# Patient Record
Sex: Male | Born: 1935 | Race: White | Marital: Married | Smoking: Former smoker
Health system: Southern US, Community
[De-identification: ages and names within clinical notes are randomized; demographics above are authoritative.]

## PROBLEM LIST (undated history)

## (undated) DIAGNOSIS — T8859XA Other complications of anesthesia, initial encounter: Secondary | ICD-10-CM

## (undated) DIAGNOSIS — Z8711 Personal history of peptic ulcer disease: Secondary | ICD-10-CM

## (undated) DIAGNOSIS — J690 Pneumonitis due to inhalation of food and vomit: Secondary | ICD-10-CM

## (undated) DIAGNOSIS — I10 Essential (primary) hypertension: Secondary | ICD-10-CM

## (undated) DIAGNOSIS — I639 Cerebral infarction, unspecified: Secondary | ICD-10-CM

## (undated) DIAGNOSIS — F0390 Unspecified dementia without behavioral disturbance: Secondary | ICD-10-CM

## (undated) DIAGNOSIS — Z9181 History of falling: Secondary | ICD-10-CM

## (undated) DIAGNOSIS — M792 Neuralgia and neuritis, unspecified: Secondary | ICD-10-CM

## (undated) DIAGNOSIS — Z8659 Personal history of other mental and behavioral disorders: Secondary | ICD-10-CM

## (undated) DIAGNOSIS — Z87442 Personal history of urinary calculi: Secondary | ICD-10-CM

## (undated) DIAGNOSIS — R413 Other amnesia: Principal | ICD-10-CM

## (undated) DIAGNOSIS — S2239XA Fracture of one rib, unspecified side, initial encounter for closed fracture: Secondary | ICD-10-CM

## (undated) DIAGNOSIS — Z8719 Personal history of other diseases of the digestive system: Secondary | ICD-10-CM

## (undated) DIAGNOSIS — R269 Unspecified abnormalities of gait and mobility: Secondary | ICD-10-CM

## (undated) DIAGNOSIS — G500 Trigeminal neuralgia: Secondary | ICD-10-CM

## (undated) DIAGNOSIS — N429 Disorder of prostate, unspecified: Secondary | ICD-10-CM

## (undated) DIAGNOSIS — T4145XA Adverse effect of unspecified anesthetic, initial encounter: Secondary | ICD-10-CM

## (undated) DIAGNOSIS — C61 Malignant neoplasm of prostate: Secondary | ICD-10-CM

## (undated) DIAGNOSIS — G63 Polyneuropathy in diseases classified elsewhere: Secondary | ICD-10-CM

## (undated) HISTORY — PX: LITHOTRIPSY: SUR834

## (undated) HISTORY — PX: JOINT REPLACEMENT: SHX530

## (undated) HISTORY — DX: Unspecified abnormalities of gait and mobility: R26.9

## (undated) HISTORY — DX: Trigeminal neuralgia: G50.0

## (undated) HISTORY — DX: Other amnesia: R41.3

## (undated) HISTORY — PX: CATARACT EXTRACTION: SUR2

## (undated) HISTORY — DX: Pneumonitis due to inhalation of food and vomit: J69.0

## (undated) HISTORY — DX: Polyneuropathy in diseases classified elsewhere: G63

## (undated) HISTORY — PX: APPENDECTOMY: SHX54

## (undated) HISTORY — DX: Fracture of one rib, unspecified side, initial encounter for closed fracture: S22.39XA

## (undated) HISTORY — PX: KNEE SURGERY: SHX244

---

## 1998-03-25 DIAGNOSIS — I639 Cerebral infarction, unspecified: Secondary | ICD-10-CM

## 1998-03-25 HISTORY — DX: Cerebral infarction, unspecified: I63.9

## 2011-06-17 ENCOUNTER — Other Ambulatory Visit: Payer: Self-pay | Admitting: Neurology

## 2011-06-17 DIAGNOSIS — F0390 Unspecified dementia without behavioral disturbance: Secondary | ICD-10-CM

## 2011-07-04 ENCOUNTER — Ambulatory Visit
Admission: RE | Admit: 2011-07-04 | Discharge: 2011-07-04 | Disposition: A | Discharge status: Home / Self Care | Payer: Medicare Other | Source: Ambulatory Visit | Attending: Neurology | Admitting: Neurology

## 2011-07-04 DIAGNOSIS — F0390 Unspecified dementia without behavioral disturbance: Secondary | ICD-10-CM

## 2012-09-04 ENCOUNTER — Encounter (HOSPITAL_COMMUNITY): Payer: Self-pay | Admitting: Emergency Medicine

## 2012-09-04 ENCOUNTER — Emergency Department (HOSPITAL_COMMUNITY): Payer: Medicare Other

## 2012-09-04 ENCOUNTER — Observation Stay (HOSPITAL_COMMUNITY)
Admission: EM | Admit: 2012-09-04 | Discharge: 2012-09-06 | Disposition: A | Discharge status: Home / Self Care | Payer: Medicare Other | Attending: Family Medicine | Admitting: Family Medicine

## 2012-09-04 DIAGNOSIS — I10 Essential (primary) hypertension: Secondary | ICD-10-CM | POA: Diagnosis present

## 2012-09-04 DIAGNOSIS — D72829 Elevated white blood cell count, unspecified: Secondary | ICD-10-CM

## 2012-09-04 DIAGNOSIS — Z8673 Personal history of transient ischemic attack (TIA), and cerebral infarction without residual deficits: Secondary | ICD-10-CM | POA: Insufficient documentation

## 2012-09-04 DIAGNOSIS — R7309 Other abnormal glucose: Secondary | ICD-10-CM | POA: Insufficient documentation

## 2012-09-04 DIAGNOSIS — Z7902 Long term (current) use of antithrombotics/antiplatelets: Secondary | ICD-10-CM | POA: Insufficient documentation

## 2012-09-04 DIAGNOSIS — G609 Hereditary and idiopathic neuropathy, unspecified: Secondary | ICD-10-CM | POA: Insufficient documentation

## 2012-09-04 DIAGNOSIS — I951 Orthostatic hypotension: Secondary | ICD-10-CM | POA: Insufficient documentation

## 2012-09-04 DIAGNOSIS — R739 Hyperglycemia, unspecified: Secondary | ICD-10-CM | POA: Diagnosis present

## 2012-09-04 DIAGNOSIS — R079 Chest pain, unspecified: Principal | ICD-10-CM | POA: Diagnosis present

## 2012-09-04 DIAGNOSIS — K922 Gastrointestinal hemorrhage, unspecified: Secondary | ICD-10-CM | POA: Diagnosis present

## 2012-09-04 DIAGNOSIS — F0390 Unspecified dementia without behavioral disturbance: Secondary | ICD-10-CM | POA: Diagnosis present

## 2012-09-04 DIAGNOSIS — R0989 Other specified symptoms and signs involving the circulatory and respiratory systems: Secondary | ICD-10-CM | POA: Insufficient documentation

## 2012-09-04 DIAGNOSIS — Z79899 Other long term (current) drug therapy: Secondary | ICD-10-CM | POA: Insufficient documentation

## 2012-09-04 HISTORY — DX: Other complications of anesthesia, initial encounter: T88.59XA

## 2012-09-04 HISTORY — DX: Adverse effect of unspecified anesthetic, initial encounter: T41.45XA

## 2012-09-04 HISTORY — DX: Cerebral infarction, unspecified: I63.9

## 2012-09-04 HISTORY — DX: Essential (primary) hypertension: I10

## 2012-09-04 HISTORY — DX: Unspecified dementia, unspecified severity, without behavioral disturbance, psychotic disturbance, mood disturbance, and anxiety: F03.90

## 2012-09-04 HISTORY — DX: Disorder of prostate, unspecified: N42.9

## 2012-09-04 HISTORY — DX: Neuralgia and neuritis, unspecified: M79.2

## 2012-09-04 LAB — BASIC METABOLIC PANEL
Calcium: 8.8 mg/dL (ref 8.4–10.5)
Creatinine, Ser: 0.98 mg/dL (ref 0.50–1.35)
GFR calc non Af Amer: 76 mL/min — ABNORMAL LOW (ref >90)
Sodium: 136 mEq/L (ref 135–145)

## 2012-09-04 LAB — OCCULT BLOOD X 1 CARD TO LAB, STOOL: Fecal Occult Bld: POSITIVE — AB

## 2012-09-04 LAB — POCT I-STAT TROPONIN I: Troponin i, poc: 0.01 ng/mL (ref 0.00–0.08)

## 2012-09-04 LAB — CBC
HCT: 30.7 % — ABNORMAL LOW (ref 39.0–52.0)
Hemoglobin: 10.4 g/dL — ABNORMAL LOW (ref 13.0–17.0)
MCH: 32.7 pg (ref 26.0–34.0)
MCHC: 33.3 g/dL (ref 30.0–36.0)
MCHC: 33.9 g/dL (ref 30.0–36.0)
MCV: 98.2 fL (ref 78.0–100.0)
Platelets: 211 10*3/uL (ref 150–400)
RBC: 3.14 MIL/uL — ABNORMAL LOW (ref 4.22–5.81)
RDW: 12.9 % (ref 11.5–15.5)
WBC: 13.9 10*3/uL — ABNORMAL HIGH (ref 4.0–10.5)

## 2012-09-04 LAB — PRO B NATRIURETIC PEPTIDE: Pro B Natriuretic peptide (BNP): 49.7 pg/mL (ref 0–450)

## 2012-09-04 LAB — GLUCOSE, CAPILLARY: Glucose-Capillary: 153 mg/dL — ABNORMAL HIGH (ref 70–99)

## 2012-09-04 MED ORDER — ASPIRIN EC 325 MG PO TBEC
325.0000 mg | DELAYED_RELEASE_TABLET | Freq: Every day | ORAL | Status: DC
  Filled 2012-09-04: qty 1

## 2012-09-04 MED ORDER — INSULIN ASPART 100 UNIT/ML ~~LOC~~ SOLN: 0.0000 [IU] | Freq: Every day | SUBCUTANEOUS | Status: DC

## 2012-09-04 MED ORDER — LORAZEPAM 0.5 MG PO TABS: 0.5000 mg | ORAL_TABLET | Freq: Once | ORAL | Status: AC | PRN

## 2012-09-04 MED ORDER — ACETAMINOPHEN 500 MG PO TABS: 1000.0000 mg | ORAL_TABLET | Freq: Four times a day (QID) | ORAL | Status: DC | PRN

## 2012-09-04 MED ORDER — ONDANSETRON HCL 4 MG PO TABS: 4.0000 mg | ORAL_TABLET | Freq: Four times a day (QID) | ORAL | Status: DC | PRN

## 2012-09-04 MED ORDER — HEPARIN SODIUM (PORCINE) 5000 UNIT/ML IJ SOLN
5000.0000 [IU] | Freq: Three times a day (TID) | INTRAMUSCULAR | Status: DC
  Administered 2012-09-04: 5000 [IU] via SUBCUTANEOUS
  Filled 2012-09-04 (×8): qty 1

## 2012-09-04 MED ORDER — ONDANSETRON HCL 4 MG/2ML IJ SOLN: 4.0000 mg | Freq: Three times a day (TID) | INTRAMUSCULAR | Status: DC | PRN

## 2012-09-04 MED ORDER — ONDANSETRON HCL 4 MG/2ML IJ SOLN
4.0000 mg | Freq: Four times a day (QID) | INTRAMUSCULAR | Status: DC | PRN
  Administered 2012-09-04: 4 mg via INTRAVENOUS
  Filled 2012-09-04: qty 2

## 2012-09-04 MED ORDER — SODIUM CHLORIDE 0.9 % IJ SOLN: 3.0000 mL | Freq: Two times a day (BID) | INTRAMUSCULAR | Status: DC

## 2012-09-04 MED ORDER — SODIUM CHLORIDE 0.9 % IJ SOLN
3.0000 mL | Freq: Two times a day (BID) | INTRAMUSCULAR | Status: DC
  Administered 2012-09-04 – 2012-09-05 (×2): 3 mL via INTRAVENOUS

## 2012-09-04 MED ORDER — RIVASTIGMINE 9.5 MG/24HR TD PT24
9.5000 mg | MEDICATED_PATCH | Freq: Every day | TRANSDERMAL | Status: DC
  Administered 2012-09-05 – 2012-09-06 (×2): 9.5 mg via TRANSDERMAL
  Filled 2012-09-04 (×3): qty 1

## 2012-09-04 MED ORDER — SODIUM CHLORIDE 0.9 % IJ SOLN: 3.0000 mL | INTRAMUSCULAR | Status: DC | PRN

## 2012-09-04 MED ORDER — SODIUM CHLORIDE 0.9 % IV SOLN: 250.0000 mL | INTRAVENOUS | Status: DC | PRN

## 2012-09-04 MED ORDER — PANTOPRAZOLE SODIUM 40 MG PO TBEC
40.0000 mg | DELAYED_RELEASE_TABLET | Freq: Every evening | ORAL | Status: DC
  Administered 2012-09-05: 40 mg via ORAL
  Filled 2012-09-04 (×3): qty 1

## 2012-09-04 MED ORDER — METOPROLOL TARTRATE 25 MG PO TABS
25.0000 mg | ORAL_TABLET | Freq: Two times a day (BID) | ORAL | Status: DC
  Administered 2012-09-04 – 2012-09-06 (×4): 25 mg via ORAL
  Filled 2012-09-04 (×5): qty 1

## 2012-09-04 MED ORDER — ALUM & MAG HYDROXIDE-SIMETH 200-200-20 MG/5ML PO SUSP: 30.0000 mL | Freq: Four times a day (QID) | ORAL | Status: DC | PRN

## 2012-09-04 MED ORDER — CLOPIDOGREL BISULFATE 75 MG PO TABS
75.0000 mg | ORAL_TABLET | Freq: Every day | ORAL | Status: DC
  Administered 2012-09-05 – 2012-09-06 (×2): 75 mg via ORAL
  Filled 2012-09-04 (×2): qty 1

## 2012-09-04 MED ORDER — IRBESARTAN 75 MG PO TABS
75.0000 mg | ORAL_TABLET | Freq: Every day | ORAL | Status: DC
  Administered 2012-09-05 – 2012-09-06 (×2): 75 mg via ORAL
  Filled 2012-09-04 (×2): qty 1

## 2012-09-04 MED ORDER — ADULT MULTIVITAMIN W/MINERALS CH
1.0000 | ORAL_TABLET | Freq: Every morning | ORAL | Status: DC
  Administered 2012-09-05 – 2012-09-06 (×2): 1 via ORAL
  Filled 2012-09-04 (×2): qty 1

## 2012-09-04 MED ORDER — SODIUM CHLORIDE 0.9 % IV SOLN
INTRAVENOUS | Status: DC
  Administered 2012-09-04: 50 mL/h via INTRAVENOUS

## 2012-09-04 MED ORDER — NITROGLYCERIN 0.4 MG SL SUBL: 0.4000 mg | SUBLINGUAL_TABLET | SUBLINGUAL | Status: DC | PRN

## 2012-09-04 MED ORDER — INSULIN ASPART 100 UNIT/ML ~~LOC~~ SOLN
0.0000 [IU] | Freq: Three times a day (TID) | SUBCUTANEOUS | Status: DC
  Administered 2012-09-05: 2 [IU] via SUBCUTANEOUS

## 2012-09-04 NOTE — Progress Notes (Signed)
Patient was admitted from E.D.  Patient just had 250 ml brown and malodorous  emesis as well as black ,malodourous stool. Patient stated that he felt better after emesis and stool. Patient is currently resting in bed.  MD on call was notified.

## 2012-09-04 NOTE — ED Notes (Signed)
Pt was in surgical waiting area and started having hungry feelings then got real dizzy, lightheaded, sweating, nauseated and chest pain that started 3pm today.  Pt has PMH strokes. Pt denies not eating well today.

## 2012-09-04 NOTE — Progress Notes (Signed)
pcp is Alden Benjamin [phone] at [facility] per pt and daughter Cm assisted by going to lobby to bring back grandson Pt reports only feeling dizzy.  Daughter has already called the dr on call for Dr Lamont Dowdy to assist with pt's medication list

## 2012-09-04 NOTE — H&P (Signed)
[facility] History and Physical  CARNELL BEAVERS ZOX:[id number] DOB: 1931/02/11 DOA: 09/04/2012  Referring physician: Dr. Hyacinth Meeker PCP: Provider Not In System  Specialists: none  Chief Complaint: Chest pain on exertion  HPI: Jeremy Shea is a 79 y.o. male  With past medical history of multiple strokes in the past currently on Plavix, dementia and hypertension that comes in for chest pain on exertion, he relates he developed acute onset of weakness as he was walking brisk leak and then developed chest pain retrosternal that got better with rest with nausea and heavily diaphoretic per her daughter's. The patient was at the [facility] visiting his wife he just had surgery. He relates it lasted about 10 minutes and it resolved. Nurses came to his rescue started him on oxygen check his blood sugar his saturations were okay and blood glucose was greater than 100.   In the ED: - His blood pressure was checked which is mildly elevated his white count is elevated at 11.0 he has a blood glucose of 206 and he is not a diabetic, EKG shows sinus rhythm with nonspecific T-wave changes in the inferior leads. First set of cardiac enzymes negative x1.  Review of Systems: The patient denies anorexia, fever, weight loss,, vision loss, decreased hearing, hoarseness, peripheral edema, balance deficits, hemoptysis, abdominal pain, melena, hematochezia, severe indigestion/heartburn, hematuria, incontinence, genital sores, muscle weakness, suspicious skin lesions, transient blindness, difficulty walking, depression, unusual weight change, abnormal bleeding, enlarged lymph nodes, angioedema, and breast masses.   Past Medical History  Diagnosis Date  . Hypertension   . Stroke   . Prostate disorder   . Dementia   . Neuropathic pain of foot    Past Surgical History  Procedure Laterality Date  . Appendectomy    . Joint replacement     Social History:  reports that he has quit smoking. His smoking  use included Cigarettes. He smoked 2.00 packs per day. He does not have any smokeless tobacco history on file. He reports that he does not drink alcohol or use illicit drugs.   Allergies  Allergen Reactions  . Penicillins   . Codeine Anxiety and Other (See Comments)    Nervousness    Family History  Problem Relation Age of Onset  . Heart attack Mother   . Heart failure Father   . Heart attack Father   . Cancer - Lung Brother     Prior to Admission medications   Medication Sig Start Date End Date Taking? Authorizing Provider  acetaminophen (TYLENOL) 500 MG tablet Take 1,000 mg by mouth every 6 (six) hours as needed for pain.   Yes Historical Provider, MD  clopidogrel (PLAVIX) 75 MG tablet Take 75 mg by mouth daily.   Yes Historical Provider, MD  fluticasone (FLONASE) 50 MCG/ACT nasal spray Place 1 spray into the nose daily as needed for rhinitis or allergies.    Yes Historical Provider, MD  metoprolol tartrate (LOPRESSOR) 25 MG tablet Take 25 mg by mouth 2 (two) times daily.   Yes Historical Provider, MD  Multiple Vitamin (MULTIVITAMIN WITH MINERALS) TABS Take 1 tablet by mouth every morning.    Yes Historical Provider, MD  olmesartan (BENICAR) 40 MG tablet Take 40 mg by mouth daily.   Yes Historical Provider, MD  pantoprazole (PROTONIX) 40 MG tablet Take 40 mg by mouth every evening.    Yes Historical Provider, MD  rivastigmine (EXELON) 9.5 mg/24hr Place 1 patch onto the skin daily.   Yes Historical Provider, MD  Physical Exam: Filed Vitals:   09/04/12 1653 09/04/12 1840 09/04/12 1926  BP: 145/57 154/66 147/73  Pulse: 65 76 77  Temp: 98.1 F (36.7 C)    TempSrc: Oral    Resp: 16 19   SpO2: 98% 98% 100%    BP 147/73  Pulse 77  Temp(Src) 98.1 F (36.7 C) (Oral)  Resp 19  SpO2 100%  General Appearance:    Alert, cooperative, no distress, appears stated age  Head:    Normocephalic, without obvious abnormality, atraumatic           Throat:   Lips, mucosa, and tongue  normal; teeth and gums normal  Neck:   Supple, symmetrical, trachea midline, no adenopathy;       thyroid:  No enlargement/tenderness/nodules; no carotid   bruit or JVD  Back:     Symmetric, no curvature, ROM normal, no CVA tenderness  Lungs:     Clear to auscultation bilaterally, respirations unlabored  Chest wall:    No tenderness or deformity  Heart:    Regular rate and rhythm, S1 and S2 normal, no murmur, rub   or gallop  Abdomen:     Soft, non-tender, bowel sounds active all four quadrants,    no masses, no organomegaly        Extremities:   Extremities normal, atraumatic, no cyanosis or edema  Pulses:   2+ and symmetric all extremities  Skin:   Skin color, texture, turgor normal, no rashes or lesions  Lymph nodes:   Cervical, supraclavicular, and axillary nodes normal  Neurologic:   CNII-XII intact. Normal strength, sensation and reflexes      throughout    Labs on Admission:  Basic Metabolic Panel:  Recent Labs Lab 09/04/12 1708  NA 136  K 4.2  CL 102  CO2 25  GLUCOSE 206*  BUN 23  CREATININE 0.98  CALCIUM 8.8   Liver Function Tests: No results found for this basename: AST, ALT, ALKPHOS, BILITOT, PROT, ALBUMIN,  in the last 168 hours No results found for this basename: LIPASE, AMYLASE,  in the last 168 hours No results found for this basename: AMMONIA,  in the last 168 hours CBC:  Recent Labs Lab 09/04/12 1708  WBC 11.0*  HGB 10.9*  HCT 32.7*  MCV 98.2  PLT 211   Cardiac Enzymes: No results found for this basename: CKTOTAL, CKMB, CKMBINDEX, TROPONINI,  in the last 168 hours  BNP (last 3 results)  Recent Labs  09/04/12 1708  PROBNP 49.7   CBG: No results found for this basename: GLUCAP,  in the last 168 hours  Radiological Exams on Admission: Ct Head Wo Contrast  09/04/2012   *RADIOLOGY REPORT*  Clinical Data: Slurred speech.  Dizziness.  Severe headache.  Right- sided weakness  CT HEAD WITHOUT CONTRAST  Technique:  Contiguous axial images were  obtained from the base of the skull through the vertex without contrast.  Comparison: MRI on 07/04/2011  Findings: There is no evidence of intracranial hemorrhage, brain edema or other signs of acute infarction.  There is no evidence of intracranial mass lesion or mass effect.  No abnormal extra-axial fluid collections are identified.  Mild diffuse cerebral atrophy is stable.  Chronic small vessel disease is unchanged in appearance.  Ventricles are stable in size. No skull abnormality identified.  IMPRESSION:  1.  No acute intracranial findings. 2.  Stable cerebral atrophy and chronic small vessel disease.   Original Report Authenticated By: Myles Rosenthal, M.D.   Dg Chest [facility]  1 View  09/04/2012   *RADIOLOGY REPORT*  Clinical Data: Acute onset shortness of breath.  PORTABLE CHEST - 1 VIEW  Comparison: None.  Findings: Heart size is within normal limits.  Both lungs are clear.  No evidence of pleural effusion.  No mass or lymphadenopathy identified.  Old bilateral rib fracture deformities incidentally noted as well as left shoulder prosthesis.  IMPRESSION: No acute findings.   Original Report Authenticated By: Myles Rosenthal, M.D.    EKG: Independently reviewed. Normal sinus rhythm PVC and nonspecific T-wave changes in the inferior leads  Assessment/Plan Chest pain on exertion - Typical chest pain now resolved. We'll need to telemetry monitor for any arrhythmias, with crackles cardiac enzymes x3. We'll get a 2-D echo check a TSH and consult cardiology as he doesn't have a cardiologist. He is going to need a stress test. - Continue metoprolol start him on statins, and aspirin. We'll continue Plavix. - NTG PRN.  Dementia: - Continue home meds. Stable  HTN (hypertension): - Blood pressures mildly elevated. We'll continue metoprolol.  Hyperglycemia - Start sliding scale insulin.   Code Status: full Family Communication: daughter Disposition Plan: OBSERVATION (indicate anticipated LOS)  Time spent: 44  MINUTES  FELIZ Rosine Beat [facility] Pager [phone]  If 7PM-7AM, please contact night-coverage [email or web address] Password [facility] 09/04/2012, 8:22 PM

## 2012-09-04 NOTE — ED Provider Notes (Signed)
History     CSN: [id number]  Arrival date & time 09/04/12  1645   First MD Initiated Contact with Patient 09/04/12 1700      Chief Complaint  Patient presents with  . Dizziness  . Chest Pain    (Consider location/radiation/quality/duration/timing/severity/associated sxs/prior treatment) HPI Comments: 79 year old male with a history of hypertension, stroke, dementia who presents with a complaint of acute onset of weakness, chest pain, nausea and diaphoresis which occurred just prior to arrival while he was walking upstairs in the hospital towards his wife's hospital room. He states that he was walking briskly, faster than usual when he had acute onset of symptoms. This was persistent, lasted approximately 20 minutes and resolved as he came down to the emergency department. He has no history of heart disease though he does have a history of a stroke and hypertension. He has had a stress test many years ago. He states that the chest pain is described as kind of a burning feeling however he describes this differently throughout the encounter.  Patient is a 79 y.o. male presenting with chest pain. The history is provided by the patient, the spouse and a relative.  Chest Pain   Past Medical History  Diagnosis Date  . Hypertension   . Stroke   . Prostate disorder   . Dementia   . Neuropathic pain of foot     Past Surgical History  Procedure Laterality Date  . Appendectomy    . Joint replacement      Family History  Problem Relation Age of Onset  . Heart attack Mother   . Heart failure Father   . Heart attack Father   . Cancer - Lung Brother     History  Substance Use Topics  . Smoking status: Former Smoker -- 2.00 packs/day    Types: Cigarettes  . Smokeless tobacco: Not on file  . Alcohol Use: No      Review of Systems  Cardiovascular: Positive for chest pain.  All other systems reviewed and are negative.    Allergies  Penicillins and Codeine  Home Medications    Current Outpatient Rx  Name  Route  Sig  Dispense  Refill  . acetaminophen (TYLENOL) 500 MG tablet   Oral   Take 1,000 mg by mouth every 6 (six) hours as needed for pain.         Marland Kitchen clopidogrel (PLAVIX) 75 MG tablet   Oral   Take 75 mg by mouth daily.         . fluticasone (FLONASE) 50 MCG/ACT nasal spray   Nasal   Place 1 spray into the nose daily as needed for rhinitis or allergies.          . metoprolol tartrate (LOPRESSOR) 25 MG tablet   Oral   Take 25 mg by mouth 2 (two) times daily.         . Multiple Vitamin (MULTIVITAMIN WITH MINERALS) TABS   Oral   Take 1 tablet by mouth every morning.          . olmesartan (BENICAR) 40 MG tablet   Oral   Take 40 mg by mouth daily.         . pantoprazole (PROTONIX) 40 MG tablet   Oral   Take 40 mg by mouth every evening.          . rivastigmine (EXELON) 9.5 mg/24hr   Transdermal   Place 1 patch onto the skin daily.  BP 147/73  Pulse 77  Temp(Src) 98.1 F (36.7 C) (Oral)  Resp 19  SpO2 100%  Physical Exam  Nursing note and vitals reviewed. Constitutional: He appears well-developed and well-nourished. No distress.  HENT:  Head: Normocephalic and atraumatic.  Mouth/Throat: Oropharynx is clear and moist. No oropharyngeal exudate.  Eyes: Conjunctivae and EOM are normal. Pupils are equal, round, and reactive to light. Right eye exhibits no discharge. Left eye exhibits no discharge. No scleral icterus.  Neck: Normal range of motion. Neck supple. No JVD present. No thyromegaly present.  Cardiovascular: Normal rate, regular rhythm, normal heart sounds and intact distal pulses.  Exam reveals no gallop and no friction rub.   No murmur heard. Pulmonary/Chest: Effort normal and breath sounds normal. No respiratory distress. He has no wheezes. He has no rales.  Abdominal: Soft. Bowel sounds are normal. He exhibits no distension and no mass. There is no tenderness.  Musculoskeletal: Normal range of motion.  He exhibits no edema and no tenderness.  Lymphadenopathy:    He has no cervical adenopathy.  Neurological: He is alert. Coordination normal.  No focal weakness or numbness other than his bilateral feet which he states are always numb. Moving all extremities, normal strength, cranial nerves III through XII are intact  Skin: Skin is warm and dry. No rash noted. No erythema.  Psychiatric: He has a normal mood and affect. His behavior is normal.    ED Course  Procedures (including critical care time)  Labs Reviewed  CBC - Abnormal; Notable for the following:    WBC 11.0 (*)    RBC 3.33 (*)    Hemoglobin 10.9 (*)    HCT 32.7 (*)    All other components within normal limits  BASIC METABOLIC PANEL - Abnormal; Notable for the following:    Glucose, Bld 206 (*)    GFR calc non Af Amer 76 (*)    GFR calc Af Amer 88 (*)    All other components within normal limits  PRO B NATRIURETIC PEPTIDE  POCT I-STAT TROPONIN I   Ct Head Wo Contrast  09/04/2012   *RADIOLOGY REPORT*  Clinical Data: Slurred speech.  Dizziness.  Severe headache.  Right- sided weakness  CT HEAD WITHOUT CONTRAST  Technique:  Contiguous axial images were obtained from the base of the skull through the vertex without contrast.  Comparison: MRI on 07/04/2011  Findings: There is no evidence of intracranial hemorrhage, brain edema or other signs of acute infarction.  There is no evidence of intracranial mass lesion or mass effect.  No abnormal extra-axial fluid collections are identified.  Mild diffuse cerebral atrophy is stable.  Chronic small vessel disease is unchanged in appearance.  Ventricles are stable in size. No skull abnormality identified.  IMPRESSION:  1.  No acute intracranial findings. 2.  Stable cerebral atrophy and chronic small vessel disease.   Original Report Authenticated By: Myles Rosenthal, M.D.   Dg Chest Port 1 View  09/04/2012   *RADIOLOGY REPORT*  Clinical Data: Acute onset shortness of breath.  PORTABLE CHEST - 1  VIEW  Comparison: None.  Findings: Heart size is within normal limits.  Both lungs are clear.  No evidence of pleural effusion.  No mass or lymphadenopathy identified.  Old bilateral rib fracture deformities incidentally noted as well as left shoulder prosthesis.  IMPRESSION: No acute findings.   Original Report Authenticated By: Myles Rosenthal, M.D.     1. Chest pain       MDM  The EKG is  abnormal in that there is an intraventricular conduction delay and a PVC however the rest of the EKG appears benign, no ST elevations, no arrhythmias. Given the onset of his symptoms with increased exertion, acute coronary syndrome must be entertained. Lab workup, chest x-ray, likely admit.  The patient is from [facility], he does not have any medical records at this institution.  ED ECG REPORT  I personally interpreted this EKG   Date: 09/04/2012   Rate: 68  Rhythm: normal sinus rhythm  QRS Axis: normal  Intervals: normal  ST/T Wave abnormalities: normal  Conduction Disutrbances:nonspecific intraventricular conduction delay  Narrative Interpretation:   Old EKG Reviewed: none available   Pt is stable appearing, he has normal troponin, slight leukocytosis, normal BNP, normal metabolic panel other than slight hyperglycemia and a chest x-ray which reveals no acute findings. CT scan also normal showing no acute stroke. The patient is stable far observational admission. This was discussed with the family members who are aware of the need for admission.      Vida Roller, MD 09/04/12 2027

## 2012-09-04 NOTE — ED Notes (Signed)
Pt back from CT. Per pt's daughter she is requesting more blood work be done on his heart bc they have heavy family hx of cardia disease and pt is stating that he feels his heart is not functioning properly.   Made EDP Jeremy Shea aware. No new orders at this time.

## 2012-09-04 NOTE — ED Notes (Signed)
YNW:[id number]> Expected date:<BR> Expected time:<BR> Means of arrival:<BR> Comments:<BR> Hold pt in short stay

## 2012-09-04 NOTE — ED Notes (Signed)
Patient transported to CT 

## 2012-09-04 NOTE — ED Notes (Signed)
Admitting MD at bedside.

## 2012-09-04 NOTE — ED Notes (Signed)
2L/min O2 applied due to c/o SOB and anxiety.

## 2012-09-05 DIAGNOSIS — K922 Gastrointestinal hemorrhage, unspecified: Secondary | ICD-10-CM | POA: Diagnosis present

## 2012-09-05 DIAGNOSIS — I10 Essential (primary) hypertension: Secondary | ICD-10-CM

## 2012-09-05 DIAGNOSIS — R7309 Other abnormal glucose: Secondary | ICD-10-CM

## 2012-09-05 DIAGNOSIS — R079 Chest pain, unspecified: Secondary | ICD-10-CM

## 2012-09-05 DIAGNOSIS — D72829 Elevated white blood cell count, unspecified: Secondary | ICD-10-CM

## 2012-09-05 LAB — CBC
HCT: 29.4 % — ABNORMAL LOW (ref 39.0–52.0)
Hemoglobin: 9.9 g/dL — ABNORMAL LOW (ref 13.0–17.0)
MCHC: 33.7 g/dL (ref 30.0–36.0)
WBC: 14.4 10*3/uL — ABNORMAL HIGH (ref 4.0–10.5)

## 2012-09-05 LAB — GLUCOSE, CAPILLARY: Glucose-Capillary: 88 mg/dL (ref 70–99)

## 2012-09-05 LAB — URINALYSIS, ROUTINE W REFLEX MICROSCOPIC
Bilirubin Urine: NEGATIVE
Hgb urine dipstick: NEGATIVE
Ketones, ur: NEGATIVE mg/dL
Specific Gravity, Urine: 1.01 (ref 1.005–1.030)
pH: 6 (ref 5.0–8.0)

## 2012-09-05 LAB — PROTIME-INR
INR: 1.17 (ref 0.00–1.49)
Prothrombin Time: 14.7 seconds (ref 11.6–15.2)

## 2012-09-05 LAB — CREATININE, SERUM
Creatinine, Ser: 0.85 mg/dL (ref 0.50–1.35)
GFR calc Af Amer: >90 mL/min (ref >90)

## 2012-09-05 LAB — HEMOGLOBIN A1C: Mean Plasma Glucose: 120 mg/dL — ABNORMAL HIGH (ref <117)

## 2012-09-05 LAB — APTT: aPTT: 29 seconds (ref 24–37)

## 2012-09-05 LAB — TSH: TSH: 1.665 u[IU]/mL (ref 0.350–4.500)

## 2012-09-05 MED ORDER — LORAZEPAM 0.5 MG PO TABS
0.5000 mg | ORAL_TABLET | Freq: Once | ORAL | Status: AC
  Administered 2012-09-05: 0.5 mg via ORAL
  Filled 2012-09-05: qty 1

## 2012-09-05 NOTE — Progress Notes (Signed)
[facility] PROGRESS NOTE  Jeremy Shea:[id number] DOB: Oct 30, 1930 DOA: 09/04/2012 PCP: Provider Not In System  Assessment/Plan: 1. Leukocytosis - At this point may be stress induced from recent reported bout of emesis - no source of infection identified - Will obtain urinalysis for further work up - CXR negative for acute findings - CT of head reported as no acute intracranial findings - Patient afebrile and does not meet SIRs criteria as such will not start antibiotics  2. Chest pain on exertion - Cardiology has evaluated patient and recommended further work up as outpatient. - Patient has had 3 sets of cardiac enzymes which have been negative - no complaints of chest discomfort - plans will be to discharge with f/u with cardiologist for further evaluation and recommendations.  Suspecting that initial presenting complaint was due to known history of orthostatic hypotension which patient has been recommended to wear compression stockings.  - since patient will stay one more night for investigation of leukocytosis will order carotid duplex given his history of orthostatic hypotension  3. GI bleed - will recheck hgb level next am - hold aspirin given strong reports of GI bleeds when aspirin is combined with plavix. - Will need outpatient GI work up.  No active bleeding currently reported by patient or nursing.  4. Demenia - stable, will need to confirm with family that patient will have help once he is ready to transition out of the hospital.  5. HTN - well controlled on current regimen - last bp reported 116/65, will continue to monitor.  6. Hyperglycemia - hgba1c pre diabetic range - As such will place on diabetic diet.  Code Status: full Family Communication: Discussed with daughter Mrs Caryn Section  Disposition Plan: Pending further work up   Consultants:  Cardiology  Procedures:  Echocardiogram  Carotid doppler  pending  Antibiotics:  None  HPI/Subjective: No acute issues reported overnight.  Objective: Filed Vitals:   09/05/12 0446 09/05/12 1000 09/05/12 1003 09/05/12 1005  BP: 159/81 125/70 139/68 116/65  Pulse: 73 72 88 94  Temp: 98.1 F (36.7 C) 98.2 F (36.8 C)    TempSrc: Oral Oral    Resp: 18 16    Height:      Weight: 88.8 kg (195 lb 12.3 oz)     SpO2: 99% 100%      Intake/Output Summary (Last 24 hours) at 09/05/12 1419 Last data filed at 09/05/12 0900  Gross per 24 hour  Intake 844.17 ml  Output    250 ml  Net 594.17 ml   Filed Weights   09/04/12 2128 09/05/12 0446  Weight: 88.9 kg (195 lb 15.8 oz) 88.8 kg (195 lb 12.3 oz)    Exam:   General:  Pt in NAD, Alert and Awake  Cardiovascular: RRR, no MRG  Respiratory: CTA BL, no wheezes  Abdomen: soft, NT, ND  Musculoskeletal: no cyanosis or clubbing   Data Reviewed: Basic Metabolic Panel:  Recent Labs Lab 09/04/12 1708 09/04/12 2245  NA 136  --   K 4.2  --   CL 102  --   CO2 25  --   GLUCOSE 206*  --   BUN 23  --   CREATININE 0.98 0.85  CALCIUM 8.8  --    Liver Function Tests: No results found for this basename: AST, ALT, ALKPHOS, BILITOT, PROT, ALBUMIN,  in the last 168 hours No results found for this basename: LIPASE, AMYLASE,  in the last 168 hours No results found for this basename: AMMONIA,  in the last 168 hours CBC:  Recent Labs Lab 09/04/12 1708 09/04/12 2245 09/05/12 0400  WBC 11.0* 13.9* 14.4*  HGB 10.9* 10.4* 9.9*  HCT 32.7* 30.7* 29.4*  MCV 98.2 97.8 97.4  PLT 211 223 224   Cardiac Enzymes:  Recent Labs Lab 09/04/12 2245 09/05/12 0400  TROPONINI <0.30 <0.30   BNP (last 3 results)  Recent Labs  09/04/12 1708  PROBNP 49.7   CBG:  Recent Labs Lab 09/04/12 2312 09/05/12 0732  GLUCAP 153* 106*    No results found for this or any previous visit (from the past 240 hour(s)).   Studies: Ct Head Wo Contrast  09/04/2012   *RADIOLOGY REPORT*  Clinical Data:  Slurred speech.  Dizziness.  Severe headache.  Right- sided weakness  CT HEAD WITHOUT CONTRAST  Technique:  Contiguous axial images were obtained from the base of the skull through the vertex without contrast.  Comparison: MRI on 07/04/2011  Findings: There is no evidence of intracranial hemorrhage, brain edema or other signs of acute infarction.  There is no evidence of intracranial mass lesion or mass effect.  No abnormal extra-axial fluid collections are identified.  Mild diffuse cerebral atrophy is stable.  Chronic small vessel disease is unchanged in appearance.  Ventricles are stable in size. No skull abnormality identified.  IMPRESSION:  1.  No acute intracranial findings. 2.  Stable cerebral atrophy and chronic small vessel disease.   Original Report Authenticated By: Myles Rosenthal, M.D.   Dg Chest Port 1 View  09/04/2012   *RADIOLOGY REPORT*  Clinical Data: Acute onset shortness of breath.  PORTABLE CHEST - 1 VIEW  Comparison: None.  Findings: Heart size is within normal limits.  Both lungs are clear.  No evidence of pleural effusion.  No mass or lymphadenopathy identified.  Old bilateral rib fracture deformities incidentally noted as well as left shoulder prosthesis.  IMPRESSION: No acute findings.   Original Report Authenticated By: Myles Rosenthal, M.D.    Scheduled Meds: . clopidogrel  75 mg Oral Daily  . heparin  5,000 Units Subcutaneous Q8H  . insulin aspart  0-15 Units Subcutaneous TID WC  . insulin aspart  0-5 Units Subcutaneous QHS  . irbesartan  75 mg Oral Daily  . metoprolol tartrate  25 mg Oral BID  . multivitamin with minerals  1 tablet Oral q morning - 10a  . pantoprazole  40 mg Oral QPM  . rivastigmine  9.5 mg Transdermal Daily  . sodium chloride  3 mL Intravenous Q12H  . sodium chloride  3 mL Intravenous Q12H   Continuous Infusions:   Principal Problem:   Chest pain on exertion Active Problems:   Dementia   HTN (hypertension)   Hyperglycemia    Time spent: > 45  minutes    Penny Pia  [facility] Pager [phone] If 7PM-7AM, please contact night-coverage at [email or web address], password [facility] 09/05/2012, 2:19 PM  LOS: 1 day

## 2012-09-05 NOTE — Progress Notes (Signed)
Pt agitated, wanting to go upstairs to visit his wife who is a pt.  Pt has an order to travel off tele to tests, but I did call and ok with  Dr Cena Benton that he can visit her with this order.  CCMD aware that pt was going to visit at 630pm and would be made aware when he returned.  Will pass this on to night shift RN

## 2012-09-05 NOTE — Progress Notes (Signed)
The PCP has new orders. Heparin injection for 0600 is to be held for now, until rounding MD does an evaluation.

## 2012-09-05 NOTE — Progress Notes (Signed)
*  PRELIMINARY RESULTS* Echocardiogram 2D Echocardiogram has been performed.  Jeremy Shea 09/05/2012, 9:02 AM

## 2012-09-05 NOTE — Consult Note (Addendum)
CARDIOLOGY CONSULT NOTE  Patient ID: Jeremy Shea MRN: [id number] DOB/AGE: 77-Nov-1932 77 y.o.  Admit date: 09/04/2012 Referring Physician  Dr. Rosine Beat. Primary Physician:  Provider Not In System Reason for Consultation  Chest pain  HPI: [name] is a fairly active 79 year old Caucasian male who lives in [place], gets his medical care here in [place], was missing his wife who has had what appears inguinal hernia repair. While he was sitting in the chair, he complained of chest pain and described as discomfort in the middle of the chest. There is no other associated symptoms of nausea, vomiting, diaphoresis. Although per review of history of present illness, there is a mention of patient feeling weak and nauseated and mildly diaphoretic associated chest pain and relieved with sitting. This lasted for about 10 minutes and resolved spontaneously. He was then admitted via emergency room for further cardiac evaluation. He does a history of remote stroke and history of tobacco use quit many years ago. He has not had any recurrence of chest pain and states that he's doing well and is ready to go home. He has chronic leg cramping, no ulceration or bruise discoloration. He has occasional dizziness there is no history to suggest syncope. There's been no recurrence of stroke or neurologic deficits. He has been told that his blood sugars were high, but has not been diagnosed as having diabetes mellitus.  Past Medical History  Diagnosis Date  . Hypertension   . Stroke   . Prostate disorder   . Dementia   . Neuropathic pain of foot   . Complication of anesthesia     hard time waking up     Past Surgical History  Procedure Laterality Date  . Appendectomy    . Joint replacement       Family History  Problem Relation Age of Onset  . Heart attack Mother   . Heart failure Father   . Heart attack Father   . Cancer - Lung Brother      Social History: History   Social History   . Marital Status: Married    Spouse Name: N/A    Number of Children: N/A  . Years of Education: N/A   Occupational History  . Not on file.   Social History Main Topics  . Smoking status: Former Smoker -- 2.00 packs/day    Types: Cigarettes    Quit date: 09/05/1978  . Smokeless tobacco: Never Used  . Alcohol Use: No  . Drug Use: No  . Sexually Active: No   Other Topics Concern  . Not on file   Social History Narrative  . No narrative on file     Prescriptions prior to admission  Medication Sig Dispense Refill  . acetaminophen (TYLENOL) 500 MG tablet Take 1,000 mg by mouth every 6 (six) hours as needed for pain.      Marland Kitchen clopidogrel (PLAVIX) 75 MG tablet Take 75 mg by mouth daily.      . fluticasone (FLONASE) 50 MCG/ACT nasal spray Place 1 spray into the nose daily as needed for rhinitis or allergies.       . metoprolol tartrate (LOPRESSOR) 25 MG tablet Take 25 mg by mouth 2 (two) times daily.      . Multiple Vitamin (MULTIVITAMIN WITH MINERALS) TABS Take 1 tablet by mouth every morning.       . olmesartan (BENICAR) 40 MG tablet Take 40 mg by mouth daily.      . pantoprazole (PROTONIX) 40 MG tablet  Take 40 mg by mouth every evening.       . rivastigmine (EXELON) 9.5 mg/24hr Place 1 patch onto the skin daily.        Scheduled Meds: . clopidogrel  75 mg Oral Daily  . heparin  5,000 Units Subcutaneous Q8H  . insulin aspart  0-15 Units Subcutaneous TID WC  . insulin aspart  0-5 Units Subcutaneous QHS  . irbesartan  75 mg Oral Daily  . metoprolol tartrate  25 mg Oral BID  . multivitamin with minerals  1 tablet Oral q morning - 10a  . pantoprazole  40 mg Oral QPM  . rivastigmine  9.5 mg Transdermal Daily  . sodium chloride  3 mL Intravenous Q12H  . sodium chloride  3 mL Intravenous Q12H   Continuous Infusions:  PRN Meds:.acetaminophen, alum & mag hydroxide-simeth, nitroGLYCERIN, ondansetron (ZOFRAN) IV, ondansetron, sodium chloride  ROS: General: no fevers/chills/night  sweats Eyes: no blurry vision, diplopia, or amaurosis ENT: no sore throat or hearing loss Resp: no cough, wheezing, or hemoptysis CV: no edema or palpitations GI: no abdominal pain, nausea, vomiting, diarrhea, or constipation GU: no dysuria, frequency, or hematuria Skin: no rash Neuro: no headache, numbness, tingling, or weakness of extremities Musculoskeletal: Has mild gait instability and uses a cane to support himself. Heme: no bleeding, DVT, or easy bruising Endo: no polydipsia or polyuria    Physical Exam: Blood pressure 159/81, pulse 73, temperature 98.1 F (36.7 C), temperature source Oral, resp. rate 18, height 5\' 10"  (1.778 m), weight 88.8 kg (195 lb 12.3 oz), SpO2 99.00%.   General appearance: alert, cooperative, appears stated age, no distress and mildly obese Lungs: clear to auscultation bilaterally Heart: regular rate and rhythm, S1, S2 normal, no murmur, click, rub or gallop Abdomen: soft, non-tender; bowel sounds normal; no masses,  no organomegaly Extremities: extremities normal, atraumatic, no cyanosis or edema Pulses: 2+ and symmetric bilateral carotid artery bruit present right worse in the left. Bounding pulses.  Neurologic: Grossly normal  Labs:   Lab Results  Component Value Date   WBC 14.4* 09/05/2012   HGB 9.9* 09/05/2012   HCT 29.4* 09/05/2012   MCV 97.4 09/05/2012   PLT 224 09/05/2012    Recent Labs Lab 09/04/12 1708 09/04/12 2245  NA 136  --   K 4.2  --   CL 102  --   CO2 25  --   BUN 23  --   CREATININE 0.98 0.85  CALCIUM 8.8  --   GLUCOSE 206*  --    Lab Results  Component Value Date   TROPONINI <0.30 09/05/2012   EKG:  09/05/2012: Normal sinus rhythm, left atrial abnormality, PAC. possible inferior infarct, old. Non-specific T. change. No significant change from prior EKG 09/04/2012.   Echo 09/05/12: Normal LVEF. EF 55%. Cannot exclude inferior and lateral hypokinesis.. Mild diastolic dysfunction    Radiology: Ct Head Wo  Contrast  09/04/2012   *RADIOLOGY REPORT*  Clinical Data: Slurred speech.  Dizziness.  Severe headache.  Right- sided weakness  CT HEAD WITHOUT CONTRAST  Technique:  Contiguous axial images were obtained from the base of the skull through the vertex without contrast.  Comparison: MRI on 07/04/2011  Findings: There is no evidence of intracranial hemorrhage, brain edema or other signs of acute infarction.  There is no evidence of intracranial mass lesion or mass effect.  No abnormal extra-axial fluid collections are identified.  Mild diffuse cerebral atrophy is stable.  Chronic small vessel disease is unchanged in appearance.  Ventricles  are stable in size. No skull abnormality identified.  IMPRESSION:  1.  No acute intracranial findings. 2.  Stable cerebral atrophy and chronic small vessel disease.   Original Report Authenticated By: Myles Rosenthal, M.D.   Dg Chest Port 1 View  09/04/2012   *RADIOLOGY REPORT*  Clinical Data: Acute onset shortness of breath.  PORTABLE CHEST - 1 VIEW  Comparison: None.  Findings: Heart size is within normal limits.  Both lungs are clear.  No evidence of pleural effusion.  No mass or lymphadenopathy identified.  Old bilateral rib fracture deformities incidentally noted as well as left shoulder prosthesis.  IMPRESSION: No acute findings.   Original Report Authenticated By: Myles Rosenthal, M.D.      ASSESSMENT AND PLAN:  1. Chest pain x 1 episode yesterday without recurrence.  2. H/O Stroke in the remote past without residual defects. No recurrence. 3. Dizziness due to orthostatic hypotension. Suspect his initial presentation with dizziness was probably due to this.  4. Hypertension. 5. Orthostatic hypotension due to peripheral neuropathy. In a patient with mild dementia, orthostatic hypotension, peripheral neuropathy, also need to exclude early parkinsonian disease. 5. Bilateral carotid bruit. 6. Guaiac positive stool, patient has anemia, will need outpatient evaluation of the  same.  Rec: Patient does need cardiac risk stratification however this no recurrence of chest pain and his EKG is essentially unremarkable without any ischemia. Hence from cardiac standpoint he can be discharged home with outpatient followup. I will set him up for outpatient stress test. He will need carotid duplex which also will be set up an outpatient basis for bilateral carotid bruit right worse than the left. With regard to his leg pain, I do not think that he has peripheral arterial disease. He has bounding pulses, I suspect peripheral neuropathy. I will add fasting lipid profile lab draw done yesterday. The dizziness that occurred yesterday been related to orthostatic hypotension versus vasovagal episode. I do not think this was TIA. From cardiac standpoint he can be discharged with outpatient follow-up.  I had a 50 minute face-to-face discussion with the patient and 2 of his daughters regarding the presentation, and I have given them option that they can certainly follow-up with me for outpatient Lexiscan Myoview stress test and carotid artery duplex versus going to [place], [place] where the patient lives.  My contact information was left with the family. He will need support stockings for orthostatic hypotension and I have discussed fall precautions with the family.  Pamella Pert, MD 09/05/2012, 9:46 AM [facility] Pager: [phone] Office: [phone] If no answer Cell [phone]

## 2012-09-05 NOTE — Progress Notes (Signed)
The hemoccult stool test was positive. PCP on call was notified of this lab. result.

## 2012-09-06 DIAGNOSIS — Z8673 Personal history of transient ischemic attack (TIA), and cerebral infarction without residual deficits: Secondary | ICD-10-CM

## 2012-09-06 DIAGNOSIS — R079 Chest pain, unspecified: Secondary | ICD-10-CM

## 2012-09-06 DIAGNOSIS — F0390 Unspecified dementia without behavioral disturbance: Secondary | ICD-10-CM

## 2012-09-06 LAB — CBC WITH DIFFERENTIAL/PLATELET
Eosinophils Absolute: 0.2 10*3/uL (ref 0.0–0.7)
Eosinophils Relative: 2 % (ref 0–5)
Hemoglobin: 9.4 g/dL — ABNORMAL LOW (ref 13.0–17.0)
Lymphs Abs: 4.2 10*3/uL — ABNORMAL HIGH (ref 0.7–4.0)
MCH: 35.3 pg — ABNORMAL HIGH (ref 26.0–34.0)
MCHC: 36.3 g/dL — ABNORMAL HIGH (ref 30.0–36.0)
MCV: 97.4 fL (ref 78.0–100.0)
Monocytes Absolute: 1.4 10*3/uL — ABNORMAL HIGH (ref 0.1–1.0)
Monocytes Relative: 12 % (ref 3–12)
RBC: 2.66 MIL/uL — ABNORMAL LOW (ref 4.22–5.81)

## 2012-09-06 LAB — GLUCOSE, CAPILLARY

## 2012-09-06 MED ORDER — NITROGLYCERIN 0.4 MG SL SUBL: 0.4000 mg | SUBLINGUAL_TABLET | SUBLINGUAL | Status: DC | PRN

## 2012-09-06 NOTE — Care Management Note (Signed)
   CARE MANAGEMENT NOTE 09/06/2012  Patient:  Jeremy Shea, Jeremy Shea   Account Number:  [id number]  Date Initiated:  09/06/2012  Documentation initiated by:  Raiford Noble  Subjective/Objective Assessment:   admitted for chest pain. hx of cva     Action/Plan:   lives with wife in [place]   Anticipated DC Date:  09/06/2012   Anticipated DC Plan:  HOME W HOME HEALTH SERVICES  In-house referral  NA      DC Planning Services  CM consult      [facility] Choice  HOME HEALTH   Choice offered to / List presented to:  C-3 Spouse   DME arranged  NA      DME agency  NA     HH arranged  HH-2 PT      HH agency  OTHER - SEE NOTE   Status of service:  Completed, signed off Medicare Important Message given?   (If response is "NO", the following Medicare IM given date fields will be blank) Date Medicare IM given:   Date Additional Medicare IM given:    Discharge Disposition:  HOME W HOME HEALTH SERVICES  Per UR Regulation:    If discussed at Long Length of Stay Meetings, dates discussed:    Comments:  09/06/12 [name] [phone] Received referral for home health services. Patient slightly confused. He lives in [place]. Therefore spoke with wife who was also a patient in the hospital at the same time. She is agreeable to having [facility] in [place] called for referral. Called and faxed referral to [facility] branch of [facility]. Received verbal confirmation from Byrd Hesselbach that they can accept referral. [facility] will contact patient for visit. Contact number for [facility] is [phone]. Provided this number to patient's wife as well. Magaly Pollina,RNCM [phone]

## 2012-09-06 NOTE — Evaluation (Signed)
Physical Therapy Evaluation Patient Details Name: Jeremy Shea MRN: [id number] DOB: 04/29/1930 Today's Date: 09/06/2012 Time: 4098-1191 PT Time Calculation (min): 20 min  PT Assessment / Plan / Recommendation Clinical Impression  79 yo male admitted with chest pain on exertion. Hx of CVA, dementia, HTN, orthostatic hypotension. On eval, pt was Min guard assist level for mobility-able to ambulate ~200 feet with use of cane. Pt denied pain or dizziness with activity. Dyspnea 2-3/4 but pt states this is not an acute occurence. Reports he has walker at home but he uses cane for ambulation. Recommend HHPT for balance tranining and supervision for mobility/OOB iniitially.     PT Assessment  All further PT needs can be met in the next venue of care    Follow Up Recommendations  Home health PT (for balance training); Supervision for OOB/mobility    Does the patient have the potential to tolerate intense rehabilitation      Barriers to Discharge        Equipment Recommendations  None recommended by PT    Recommendations for Other Services     Frequency      Precautions / Restrictions Precautions Precautions: Fall Restrictions Weight Bearing Restrictions: No   Pertinent Vitals/Pain Pt denies pain      Mobility  Bed Mobility Bed Mobility: Not assessed Details for Bed Mobility Assistance: pt sitting eob Transfers Transfers: Sit to Stand;Stand to Sit Sit to Stand: 4: Min guard;From bed Stand to Sit: 6: Modified independent (Device/Increase time);To bed Details for Transfer Assistance: VCs safety. Cues for pt to stand statically a minute or two to ensure he is not dizzy.  Ambulation/Gait Ambulation/Gait Assistance: 4: Min guard Ambulation Distance (Feet): 200 Feet Assistive device: Straight cane Ambulation/Gait Assistance Details: intermittently unsteady, but no LOB. Fair gait speed.  Gait Pattern: Step-through pattern    Exercises     PT Diagnosis:    PT Problem List:  Decreased balance;Decreased mobility PT Treatment Interventions:     PT Goals    Visit Information  Last PT Received On: 09/06/12 Assistance Needed: +1    Subjective Data  Subjective: Ive been in bed 3 days Patient Stated Goal: home   Prior Functioning  Home Living Lives With: Spouse (who is in hospital for surgery) Available Help at Discharge: Family (pt states daughter will help out a little) Type of Home: House Home Access: Stairs to enter [employer] of Steps: 2 Entrance Stairs-Rails: None Home Layout: One level Home Adaptive Equipment: Straight cane;Walker - rolling Prior Function Level of Independence: Independent with assistive device(s) Able to Take Stairs?: Yes Comments: pt normally uses cane Communication Communication: No difficulties    Cognition  Cognition Arousal/Alertness: Awake/alert Behavior During Therapy: WFL for tasks assessed/performed Overall Cognitive Status: Within Functional Limits for tasks assessed    Extremity/Trunk Assessment Right Lower Extremity Assessment RLE ROM/Strength/Tone: [facility] for tasks assessed Left Lower Extremity Assessment LLE ROM/Strength/Tone: [facility] for tasks assessed   Balance Balance Balance Assessed: Yes Static Standing Balance Static Standing - Balance Support: Right upper extremity supported Static Standing - Level of Assistance: 5: Stand by assistance Static Standing - Comment/# of Minutes: NO LOB but pt is unsteady.  Dynamic Standing Balance Dynamic Standing - Balance Support: Right upper extremity supported;During functional activity Dynamic Standing - Level of Assistance: 5: Stand by assistance Dynamic Standing - Comments: Intermittent stumbles, wavering while ambulating.   End of Session PT - End of Session Equipment Utilized During Treatment: Gait belt Activity Tolerance: Patient tolerated treatment well Patient left:  in bed;with call bell/phone within reach;with bed alarm set  GP Functional  Assessment Tool Used: clinical mobility Functional Limitation: Mobility: Walking and moving around Mobility: Walking and Moving Around Current Status [phone]): At least 1 percent but less than 20 percent impaired, limited or restricted Mobility: Walking and Moving Around Goal Status [phone]): At least 1 percent but less than 20 percent impaired, limited or restricted Mobility: Walking and Moving Around Discharge Status [phone]): At least 1 percent but less than 20 percent impaired, limited or restricted   Rebeca Alert, MPT Pager: [phone]

## 2012-09-06 NOTE — Discharge Summary (Signed)
Physician Discharge Summary  Jeremy Shea ZOX:[id number] DOB: 01-May-1930 DOA: 09/04/2012  PCP: Provider Not In System  Admit date: 09/04/2012 Discharge date: 09/06/2012  Time spent: > 35 minutes  Recommendations for Outpatient Follow-up:  1. Please be sure to follow up with BP's and adjust antihypertensive medication accordingly.  Patient has history of orthostatic hypotension.  Cardiology has recommended support stockings.  Family has been instructed to supply these to patient. 2. Pt will need further cardiac risk stratification (stress test) as outpatient. 3. Will also need outpatient GI evaluation for positive fecal occult blood test in ED  Discharge Diagnoses:  Principal Problem:   Chest pain on exertion Active Problems:   Dementia   HTN (hypertension)   Hyperglycemia   Leukocytosis, unspecified   GI bleed   Discharge Condition: stable  Diet recommendation: low sodium heart healthy diet.  Filed Weights   09/04/12 2128 09/05/12 0446 09/06/12 0500  Weight: 88.9 kg (195 lb 15.8 oz) 88.8 kg (195 lb 12.3 oz) 91.3 kg (201 lb 4.5 oz)    History of present illness:  79 y/o with history of strokes on plavix, dementia, htn, and h/o orthostatic hypotension.  Presented to the ED after near syncope with initial reports of chest discomfort  Hospital Course:  1. Leukocytosis - At this point may be stress induced from recent reported bout of emesis  - no source of infection identified  - Will obtain urinalysis for further work up  - CXR negative for acute findings  - CT of head reported as no acute intracranial findings  - U/A negative.  WBC trending down off of antibiotics.  CBC with differential showed no elevated absolute neutrophil count.  2. Chest pain on exertion  - Cardiology has evaluated patient and recommended further work up as outpatient.  Cardiology recommended the following:  Rec: Patient does need cardiac risk stratification however this no recurrence of chest pain  and his EKG is essentially unremarkable without any ischemia. Hence from cardiac standpoint he can be discharged home with outpatient followup. I will set him up for outpatient stress test. He will need carotid duplex which also will be set up an outpatient basis for bilateral carotid bruit right worse than the left. With regard to his leg pain, I do not think that he has peripheral arterial disease. He has bounding pulses, I suspect peripheral neuropathy. I will add fasting lipid profile lab draw done yesterday. The dizziness that occurred yesterday been related to orthostatic hypotension versus vasovagal episode. I do not think this was TIA.  From cardiac standpoint he can be discharged with outpatient follow-up. I had a 50 minute face-to-face discussion with the patient and 2 of his daughters regarding the presentation, and I have given them option that they can certainly follow-up with me for outpatient Lexiscan Myoview stress test and carotid artery duplex versus going to [place], [place] where the patient lives. My contact information was left with the family.  He will need support stockings for orthostatic hypotension and I have discussed fall precautions with the family.   - Patient has had 3 sets of cardiac enzymes which have been negative  - no complaints of chest discomfort  - plans will be to discharge with f/u with cardiologist for further evaluation and recommendations. Suspecting that initial presenting complaint was due to known history of orthostatic hypotension which patient has been recommended to wear compression stockings.  - Carotid doppler was ordered and completed here at hospital which will be available for Cardiologist  to evaluate. Daughter is currently aware of this.   3. GI bleed  - will recheck hgb level next am and currently steady at 9.4. - hold aspirin given strong reports of GI bleeds when aspirin is combined with plavix.  - Will need outpatient GI work up (I have  discussed this with daughter and she verbalizes her understanding). No active bleeding currently reported by patient or nursing.  - Briefly discussed with GI specialist who recommended outpatient work up.  4. Demenia  - stable, will need to confirm with family that patient will have help once he is ready to transition out of the hospital.   5. HTN  - will continue home regimen.  6. Hyperglycemia  - hgba1c pre diabetic range  - As such will place on diabetic diet.    Procedures:  Echo  Carotid doppler  Consultations:  Cardiology: Dr. Jacinto Halim  Discharge Exam: Filed Vitals:   09/05/12 1445 09/05/12 2119 09/06/12 0500 09/06/12 0528  BP: 137/68 132/58  106/50  Pulse: 65 65  75  Temp: 97.7 F (36.5 C) 98.7 F (37.1 C)  98.9 F (37.2 C)  TempSrc: Oral Axillary  Oral  Resp: 16 16  16   Height:      Weight:   91.3 kg (201 lb 4.5 oz)   SpO2: 100% 96%  96%    General: Pt in NAD, Alert and Awake Cardiovascular: RRR, no MRG Respiratory: CTA BL, no wheezes Abdomen: soft, NT, obese, ND  Discharge Instructions  Discharge Orders   Future Orders Complete By Expires     Call MD for:  extreme fatigue  As directed     Call MD for:  severe uncontrolled pain  As directed     Call MD for:  temperature >100.4  As directed     Diet - low sodium heart healthy  As directed     Discharge instructions  As directed     Comments:      Please be sure to follow up with your primary care physician in 1-2 weeks for continued monitoring of your blood pressures.  Also you will need to follow up with the cardiologist for further recommendations regarding further cardiac risk stratification.    Increase activity slowly  As directed         Medication List    TAKE these medications       acetaminophen 500 MG tablet  Commonly known as:  TYLENOL  Take 1,000 mg by mouth every 6 (six) hours as needed for pain.     clopidogrel 75 MG tablet  Commonly known as:  PLAVIX  Take 75 mg by mouth  daily.     fluticasone 50 MCG/ACT nasal spray  Commonly known as:  FLONASE  Place 1 spray into the nose daily as needed for rhinitis or allergies.     metoprolol tartrate 25 MG tablet  Commonly known as:  LOPRESSOR  Take 25 mg by mouth 2 (two) times daily.     multivitamin with minerals Tabs  Take 1 tablet by mouth every morning.     nitroGLYCERIN 0.4 MG SL tablet  Commonly known as:  NITROSTAT  Place 1 tablet (0.4 mg total) under the tongue every 5 (five) minutes as needed for chest pain.     olmesartan 40 MG tablet  Commonly known as:  BENICAR  Take 40 mg by mouth daily.     pantoprazole 40 MG tablet  Commonly known as:  PROTONIX  Take 40 mg by mouth  every evening.     rivastigmine 9.5 mg/24hr  Commonly known as:  EXELON  Place 1 patch onto the skin daily.       Allergies  Allergen Reactions  . Penicillins   . Codeine Anxiety and Other (See Comments)    Nervousness      The results of significant diagnostics from this hospitalization (including imaging, microbiology, ancillary and laboratory) are listed below for reference.    Significant Diagnostic Studies: Ct Head Wo Contrast  09/04/2012   *RADIOLOGY REPORT*  Clinical Data: Slurred speech.  Dizziness.  Severe headache.  Right- sided weakness  CT HEAD WITHOUT CONTRAST  Technique:  Contiguous axial images were obtained from the base of the skull through the vertex without contrast.  Comparison: MRI on 07/04/2011  Findings: There is no evidence of intracranial hemorrhage, brain edema or other signs of acute infarction.  There is no evidence of intracranial mass lesion or mass effect.  No abnormal extra-axial fluid collections are identified.  Mild diffuse cerebral atrophy is stable.  Chronic small vessel disease is unchanged in appearance.  Ventricles are stable in size. No skull abnormality identified.  IMPRESSION:  1.  No acute intracranial findings. 2.  Stable cerebral atrophy and chronic small vessel disease.    Original Report Authenticated By: Myles Rosenthal, M.D.   Dg Chest Port 1 View  09/04/2012   *RADIOLOGY REPORT*  Clinical Data: Acute onset shortness of breath.  PORTABLE CHEST - 1 VIEW  Comparison: None.  Findings: Heart size is within normal limits.  Both lungs are clear.  No evidence of pleural effusion.  No mass or lymphadenopathy identified.  Old bilateral rib fracture deformities incidentally noted as well as left shoulder prosthesis.  IMPRESSION: No acute findings.   Original Report Authenticated By: Myles Rosenthal, M.D.    Microbiology: No results found for this or any previous visit (from the past 240 hour(s)).   Labs: Basic Metabolic Panel:  Recent Labs Lab 09/04/12 1708 09/04/12 2245  NA 136  --   K 4.2  --   CL 102  --   CO2 25  --   GLUCOSE 206*  --   BUN 23  --   CREATININE 0.98 0.85  CALCIUM 8.8  --    Liver Function Tests: No results found for this basename: AST, ALT, ALKPHOS, BILITOT, PROT, ALBUMIN,  in the last 168 hours No results found for this basename: LIPASE, AMYLASE,  in the last 168 hours No results found for this basename: AMMONIA,  in the last 168 hours CBC:  Recent Labs Lab 09/04/12 1708 09/04/12 2245 09/05/12 0400 09/06/12 0443  WBC 11.0* 13.9* 14.4* 11.7*  NEUTROABS  --   --   --  5.8  HGB 10.9* 10.4* 9.9* 9.4*  HCT 32.7* 30.7* 29.4* 25.9*  MCV 98.2 97.8 97.4 97.4  PLT 211 223 224 199   Cardiac Enzymes:  Recent Labs Lab 09/04/12 2245 09/05/12 0400  TROPONINI <0.30 <0.30   BNP: BNP (last 3 results)  Recent Labs  09/04/12 1708  PROBNP 49.7   CBG:  Recent Labs Lab 09/05/12 0732 09/05/12 1200 09/05/12 1650 09/05/12 2059 09/06/12 0749  GLUCAP 106* 132* 99 88 99       Signed:  Camp Gopal  [facility] 09/06/2012, 9:50 AM

## 2012-09-06 NOTE — Progress Notes (Signed)
*  PRELIMINARY RESULTS* Vascular Ultrasound Carotid Duplex (Doppler) has been completed.   There is evidence of elevated velocities of the right proximal internal carotid artery, suggestive of 40-59% stenosis (upper end of range by systolic velocity; 291/39). There is significant shadowing of the proximal right internal carotid artery secondary to calcific plaque, therefore unable to fully assess severity of plaque. Vertebral arteries are patent with antegrade flow.  09/06/2012 8:15 AM Gertie Fey, RVT, RDCS, RDMS

## 2012-09-24 ENCOUNTER — Ambulatory Visit: Payer: Medicare Other | Admitting: Cardiology

## 2013-12-13 ENCOUNTER — Telehealth: Payer: Self-pay | Admitting: *Deleted

## 2013-12-13 NOTE — Telephone Encounter (Signed)
Daughter calling asking for appt for father relating to trigeminal neuralgia.  Was former Dr. Erling Cruz Pt.  Not seen for this.  Asking for referral with Dr. Jannifer Franklin. I told her to call pcp and referral to specifically Dr. Jannifer Franklin. She will.  I relayed to Diane, new pt coor.

## 2013-12-22 ENCOUNTER — Ambulatory Visit (INDEPENDENT_AMBULATORY_CARE_PROVIDER_SITE_OTHER): Payer: Medicare HMO | Admitting: Neurology

## 2013-12-22 ENCOUNTER — Encounter: Payer: Self-pay | Admitting: Neurology

## 2013-12-22 ENCOUNTER — Encounter (INDEPENDENT_AMBULATORY_CARE_PROVIDER_SITE_OTHER): Payer: Self-pay

## 2013-12-22 VITALS — Ht 70.5 in | Wt 186.0 lb

## 2013-12-22 DIAGNOSIS — R413 Other amnesia: Secondary | ICD-10-CM

## 2013-12-22 DIAGNOSIS — G63 Polyneuropathy in diseases classified elsewhere: Secondary | ICD-10-CM

## 2013-12-22 DIAGNOSIS — R269 Unspecified abnormalities of gait and mobility: Secondary | ICD-10-CM

## 2013-12-22 DIAGNOSIS — G500 Trigeminal neuralgia: Secondary | ICD-10-CM

## 2013-12-22 HISTORY — DX: Unspecified abnormalities of gait and mobility: R26.9

## 2013-12-22 HISTORY — DX: Polyneuropathy in diseases classified elsewhere: G63

## 2013-12-22 HISTORY — DX: Other amnesia: R41.3

## 2013-12-22 HISTORY — DX: Trigeminal neuralgia: G50.0

## 2013-12-22 MED ORDER — CARBAMAZEPINE 100 MG PO CHEW: CHEWABLE_TABLET | ORAL | Status: DC

## 2013-12-22 NOTE — Progress Notes (Signed)
Reason for visit: Trigeminal neuralgia  Jeremy Shea is a 80 y.o. male  History of present illness:  Jeremy Shea is an 80 year old right-handed white male with a history of a memory disturbance, previously seen in March of 2013 by Dr. Erling Cruz. The patient has been on the Exelon patch, and he had onset of the memory issues around 2008 or 2009. The patient just recently stopped operating a motor vehicle within the last month. The patient developed onset of pain in the left frontal area of the head in the latter part of December of 2014. This pain has persisted. The pain has been associated with a dull achy sensation with sharp jabbing pains that occur when he tries to eat. The patient is sleeping well, and the pain does not wake him up at night. Talking does not induce the pain. The patient has lost about 14 pounds because he is not eating as much secondary to the pain. The patient has not undergone a repeat MRI scan of the brain. The patient last had an MRI for memory issues in April of 2013. The patient was placed on gabapentin, but he only took 100 mg 3 times daily without benefit. The patient did not increase the dose to higher levels. The patient is felt to have a peripheral neuropathy, and he has had a significant decline in balance over the last several months. He is now using a cane for ambulation. He has numbness in the legs, without discomfort in the legs or back or neck. He denies any numbness of the hands. The patient is sent to this office for further evaluation. He has a history of a stroke event in 2000.  Past Medical History  Diagnosis Date  . Hypertension   . Stroke   . Prostate disorder   . Dementia   . Neuropathic pain of foot   . Complication of anesthesia     hard time waking up  . Memory disorder 12/22/2013  . Trigeminal neuralgia 12/22/2013  . Polyneuropathy in other diseases classified elsewhere 12/22/2013  . Abnormality of gait 12/22/2013  . Rib fracture     Past  Surgical History  Procedure Laterality Date  . Appendectomy    . Joint replacement      Left shoulder, rotator cuff tear repair  . Cataract extraction Right   . Knee surgery Left     Family History  Problem Relation Age of Onset  . Heart attack Mother   . Heart failure Father   . Heart attack Father   . Cancer - Lung Brother     Social history:  reports that he quit smoking about 35 years ago. His smoking use included Cigarettes. He smoked 2.00 packs per day. He has never used smokeless tobacco. He reports that he does not drink alcohol or use illicit drugs.  Medications:  Current Outpatient Prescriptions on File Prior to Visit  Medication Sig Dispense Refill  . acetaminophen (TYLENOL) 500 MG tablet Take 1,000 mg by mouth every 6 (six) hours as needed for pain.      Marland Kitchen clopidogrel (PLAVIX) 75 MG tablet Take 75 mg by mouth daily.      . fluticasone (FLONASE) 50 MCG/ACT nasal spray Place 1 spray into the nose daily as needed for rhinitis or allergies.       . metoprolol tartrate (LOPRESSOR) 25 MG tablet Take 25 mg by mouth 2 (two) times daily.      . Multiple Vitamin (MULTIVITAMIN WITH MINERALS) TABS Take  1 tablet by mouth every morning.       . pantoprazole (PROTONIX) 40 MG tablet Take 40 mg by mouth 2 (two) times daily.       . rivastigmine (EXELON) 9.5 mg/24hr Place 13.3 patches onto the skin daily.        No current facility-administered medications on file prior to visit.      Allergies  Allergen Reactions  . Aspirin   . Penicillins   . Sulfa Antibiotics Other (See Comments)  . Codeine Anxiety and Other (See Comments)    Nervousness    ROS:  Out of a complete 14 system review of symptoms, the patient complains only of the following symptoms, and all other reviewed systems are negative.  Eye pain Constipation Snoring Incontinence of the bladder Joint pain, joint swelling, walking difficulties Skin rash, moles Bruising easily Memory loss, dizziness  Blood  pressure , height 5' 10.5" (1.791 m), weight 186 lb (84.369 kg).  Physical Exam  General: The patient is alert and cooperative at the time of the examination.  Eyes: Pupils are equal, round, and reactive to light. Discs are flat bilaterally.  Neck: The neck is supple, no carotid bruits are noted.  Respiratory: The respiratory examination is clear.  Cardiovascular: The cardiovascular examination reveals a regular rate and rhythm, no obvious murmurs or rubs are noted.  Skin: Extremities are with 1+ edema at the ankles bilaterally.  Neurologic Exam  Mental status: The patient is alert and oriented x 3 at the time of the examination. The patient has apparent normal recent and remote memory, with an apparently normal attention span and concentration ability. Mini-Mental status examination done today shows a total score 24/30.  Cranial nerves: Facial symmetry is not present. The patient has mild ptosis of the left eyelid. There is good sensation of the face to pinprick and soft touch bilaterally, with exception that there is a decrease in pinprick sensation on the left forehead. The strength of the facial muscles and the muscles to head turning and shoulder shrug are normal bilaterally. Speech is well enunciated, no aphasia or dysarthria is noted. Extraocular movements are full. Visual fields are full. The tongue is midline, and the patient has symmetric elevation of the soft palate. No obvious hearing deficits are noted.  Motor: The motor testing reveals 5 over 5 strength of all 4 extremities. Good symmetric motor tone is noted throughout.  Sensory: Sensory testing is intact to pinprick, soft touch, vibration sensation, and position sense on the upper extremities. With the lower extremities, the patient has a significant stocking pattern pinprick sensory deficit above the knees bilaterally. The patient has severe impairment of position and vibration sensation in both feet. The patient has lack of  vibration sensation at the left knee, can sense that the right knee. No evidence of extinction is noted.  Coordination: Cerebellar testing reveals good finger-nose-finger bilaterally. The patient has ataxia with heel-to-shin bilaterally.  Gait and station: Gait is wide-based, ataxic. The patient uses a cane for ambulation. Tandem gait was not attempted. Romberg is positive. No drift is seen.  Reflexes: Deep tendon reflexes are symmetric, but are depressed bilaterally. The knee jerk reflexes are present, absent at the ankles. Toes are downgoing bilaterally.   MRI brain 07/04/11:  Impression: This MRI scan of the brain  shows mild changes of age-related chronic microvascular ischemia and generalized cerebral atrophy.   Assessment/Plan:  1. Left forehead pain, possible trigeminal neuralgia, V1 distribution  2. Peripheral neuropathy  3. Gait disturbance  4. Memory disturbance  The patient has what appears to be a significant peripheral neuropathy with a severe gait disorder. The patient indicates that he has not had a workup for this. The patient will be set up for blood work, and he will have nerve conduction studies of both legs, and one arm. EMG evaluation will be done on one leg. He will have MRI evaluation of the brain as the pain he is having in the left forehead is somewhat atypical for trigeminal neuralgia in that he notes decreased pinprick sensation on the left forehead, not usually seen with true trigeminal neuralgia. The patient will followup for the EMG evaluation. He will be placed on low-dose carbamazepine.   Jill Alexanders MD 12/22/2013 2:37 PM  [facility] [address] [place], [place] [address]  Phone [phone] Fax [phone]

## 2013-12-22 NOTE — Patient Instructions (Signed)

## 2013-12-27 LAB — IFE AND PE, SERUM
ALBUMIN SERPL ELPH-MCNC: 3.6 g/dL (ref 3.2–5.6)
ALPHA 1: 0.2 g/dL (ref 0.1–0.4)
ALPHA2 GLOB SERPL ELPH-MCNC: 0.8 g/dL (ref 0.4–1.2)
Albumin/Glob SerPl: 1.3 (ref 0.7–2.0)
B-GLOBULIN SERPL ELPH-MCNC: 1.2 g/dL (ref 0.6–1.3)
GAMMA GLOB SERPL ELPH-MCNC: 0.7 g/dL (ref 0.5–1.6)
Globulin, Total: 2.8 g/dL (ref 2.0–4.5)
IGA/IMMUNOGLOBULIN A, SERUM: 565 mg/dL — AB (ref 91–414)
IgG (Immunoglobin G), Serum: 936 mg/dL (ref 700–1600)
IgM (Immunoglobulin M), Srm: 33 mg/dL — ABNORMAL LOW (ref 40–230)
Total Protein: 6.4 g/dL (ref 6.0–8.5)

## 2013-12-27 LAB — VITAMIN B12: Vitamin B-12: 372 pg/mL (ref 211–946)

## 2013-12-27 LAB — ANGIOTENSIN CONVERTING ENZYME: ANGIO CONVERT ENZYME: 31 U/L (ref 14–82)

## 2013-12-27 LAB — RHEUMATOID FACTOR: RHEUMATOID FACTOR: 7.4 [IU]/mL (ref 0.0–13.9)

## 2013-12-27 LAB — SEDIMENTATION RATE: SED RATE: 9 mm/h (ref 0–30)

## 2013-12-27 LAB — ANA W/REFLEX: Anti Nuclear Antibody(ANA): NEGATIVE

## 2013-12-27 LAB — COPPER, SERUM: COPPER: 109 ug/dL (ref 72–166)

## 2013-12-29 NOTE — Progress Notes (Signed)
Quick Note:  Spoke to patient's wife and relayed unremarkable lab results, per Dr. Jannifer Franklin. ______

## 2013-12-31 ENCOUNTER — Telehealth: Payer: Self-pay | Admitting: Neurology

## 2013-12-31 NOTE — Telephone Encounter (Signed)
I called patient. The patient is to come off of the carbamazepine. I will see him back for EMG evaluation on October 12, we'll discuss adding another medication at that time such as gabapentin.

## 2013-12-31 NOTE — Telephone Encounter (Signed)
Jeremy Shea called at 2am today regarding possible medication reaction of Jeremy Shea. He was recently prescribed tegretol 100mg  bid for possible trigeminal neuralgia. Jeremy Shea said for a couple of days, Jeremy Shea felt itchy at the chest and back with redness. He took a shower trying to ease the itchiness but not effective. She called poison control and was told either medication reaction or sun exposure. He did have sun exposure yesterday but only to face but chest or back. It is more likely medication reaction in the setting of new medication started within this week. She gave her benadryl-Allergy 25mg  at 2am and 4am. She checked it again at 6am, the redness is going down.    Usually benadryl can be give Q6h PRN for allergic reaction but since he has hx of dementia also on the excelon patch, therefore I told Jeremy Shea not to give too much benadryl to cause cognitive side effects. I recommend one more dose is reasonable if itchy continues. If symptoms tolerable, he should stop taking benadryl.   I will relay the message to Dr. Jannifer Franklin and call pt back if there is new recommendations.  Jeremy Hawking, MD PhD Stroke Neurology 12/31/2013 7:06 AM

## 2013-12-31 NOTE — Telephone Encounter (Signed)
I have instructed pt to stop the offending medication - tegretol, and close monitor, call back if symptoms getting worse. He has appointment with Dr. Jannifer Franklin next Monday.

## 2014-01-03 ENCOUNTER — Telehealth: Payer: Self-pay | Admitting: Neurology

## 2014-01-03 ENCOUNTER — Ambulatory Visit
Admission: RE | Admit: 2014-01-03 | Discharge: 2014-01-03 | Disposition: A | Discharge status: Home / Self Care | Payer: Medicare HMO | Source: Ambulatory Visit | Attending: Neurology | Admitting: Neurology

## 2014-01-03 ENCOUNTER — Encounter (INDEPENDENT_AMBULATORY_CARE_PROVIDER_SITE_OTHER): Payer: Self-pay

## 2014-01-03 ENCOUNTER — Ambulatory Visit (INDEPENDENT_AMBULATORY_CARE_PROVIDER_SITE_OTHER): Payer: Medicare HMO | Admitting: Neurology

## 2014-01-03 DIAGNOSIS — R413 Other amnesia: Secondary | ICD-10-CM

## 2014-01-03 DIAGNOSIS — R269 Unspecified abnormalities of gait and mobility: Secondary | ICD-10-CM

## 2014-01-03 DIAGNOSIS — G63 Polyneuropathy in diseases classified elsewhere: Secondary | ICD-10-CM

## 2014-01-03 DIAGNOSIS — Z0289 Encounter for other administrative examinations: Secondary | ICD-10-CM

## 2014-01-03 DIAGNOSIS — G500 Trigeminal neuralgia: Secondary | ICD-10-CM

## 2014-01-03 MED ORDER — GABAPENTIN 100 MG PO CAPS: ORAL_CAPSULE | ORAL | Status: DC

## 2014-01-03 NOTE — Telephone Encounter (Signed)
Patient here for an appointment today for nerve conduction and wanted Dr. Jannifer Franklin to be aware he had a reaction to the carbamazepine 100 mg. He broke out in hives on his chest and back, legs and buttocks. It was very itchy also.

## 2014-01-03 NOTE — Procedures (Signed)
     HISTORY:  Jeremy Shea is an 80 year old gentleman with a history of numbness in the feet and associated gait disorder. The patient is being evaluated for a possible peripheral neuropathy. The patient walks with a cane.  NERVE CONDUCTION STUDIES:  Nerve conduction studies were performed on the right upper extremity. The distal motor latencies and motor amplitudes for the median and ulnar nerves were within normal limits. The F wave latencies and nerve conduction velocities for these nerves were also normal. The sensory latencies for the median and ulnar nerves were normal.  Nerve conduction studies done on both lower extremities shows prolongation of the distal motor latency for the right peroneal nerve, normal on the left, with low motor amplitudes for these nerves bilaterally. The distal motor latencies for the posterior tibial nerves were prolonged bilaterally, with low motor amplitudes seen on the right, normal on the left. The nerve conduction velocities for the peroneal nerves bilaterally and for the left posterior tibial nerves were normal, the right posterior tibial nerve demonstrated some mild slowing. The H reflex latencies were unobtainable bilaterally. The peroneal sensory latencies were unobtainable bilaterally.  EMG STUDIES:  EMG study was performed on the right lower extremity:  The tibialis anterior muscle reveals 2 to 8K motor units with decreased recruitment. 2+ fibrillations and positive waves were seen. The peroneus tertius muscle reveals 2 to 8K motor units with decreased recruitment. 2+ fibrillations and positive waves were seen. The medial gastrocnemius muscle reveals up to 10K motor units with decreased recruitment. One plus fibrillations and positive waves were seen. The vastus lateralis muscle reveals 2 to 4K motor units with full recruitment. No fibrillations or positive waves were seen. The iliopsoas muscle reveals 2 to 4K motor units with full recruitment. No  fibrillations or positive waves were seen. The biceps femoris muscle (long head) reveals 2 to 4K motor units with full recruitment. No fibrillations or positive waves were seen. The lumbosacral paraspinal muscles were tested at 3 levels, and revealed no abnormalities of insertional activity at all 3 levels tested. There was good relaxation.   IMPRESSION:  Nerve conduction studies done on the right upper extremity and both lower extremities shows findings consistent with a primarily axonal peripheral neuropathy of moderate severity. EMG evaluation of the right lower extremity shows distal acute and chronic denervation consistent with the diagnosis of peripheral neuropathy. No evidence of an overlying lumbosacral radiculopathy was seen.  Jill Alexanders MD 01/03/2014 1:53 PM  [facility] [address] [place], [place] [address]  Phone [phone] Fax [phone]

## 2014-01-03 NOTE — Telephone Encounter (Signed)
Not sure why the patient left this message, we discussed this issue in great detail during the visit. The patient does have a dementing illness, is on Exelon patch.

## 2014-01-03 NOTE — Progress Notes (Signed)
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 The patient is felt to have a peripheral neuropathy, and he comes into the office today for an evaluation. He also has left upper face discomfort that is worse with eating. The was treated with carbamazepine for this discomfort, but he developed a skin rash with this. The carbamazepine has been discontinued, the patient had been on low-dose gabapentin previously, but he tolerated the medication. He did not get benefit from 100 mg 3 times daily dosing.  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 EMG of the right leg is consistent with the diagnosis of a peripheral neuropathy. No overlying lumbosacral radiculopathy were seen.  The patient will be set up for physical therapy for gait training. Blood work was unremarkable. The patient will have MRI evaluation the brain to evaluate the facial pain.  He will followup in 3-4 months.

## 2014-01-04 ENCOUNTER — Telehealth: Payer: Self-pay | Admitting: Neurology

## 2014-01-04 NOTE — Telephone Encounter (Signed)
Called patient. No change in his MRI from 2013, no findings to explain the etiology of facial pain. He will be going for physical therapy for gait training. He has a significant peripheral neuropathy associated with his gait disorder.   MRI brain 01/03/2014:  Impression   Abnormal MRI brain (without) demonstrating: 1. Moderate perisylvian and severe mesial temporal atrophy. 2. Moderate ventriculomegaly. 3. Mild periventricular gliosis. 4. No acute findings. No significant change from MRI 07/04/11.

## 2014-02-01 ENCOUNTER — Other Ambulatory Visit: Payer: Self-pay | Admitting: Neurology

## 2014-02-01 DIAGNOSIS — R269 Unspecified abnormalities of gait and mobility: Secondary | ICD-10-CM

## 2014-02-09 ENCOUNTER — Encounter: Payer: Self-pay | Admitting: Neurology

## 2014-02-15 ENCOUNTER — Encounter: Payer: Self-pay | Admitting: Neurology

## 2014-04-12 ENCOUNTER — Telehealth: Payer: Self-pay | Admitting: Neurology

## 2014-04-12 MED ORDER — GABAPENTIN 300 MG PO CAPS: 300.0000 mg | ORAL_CAPSULE | Freq: Three times a day (TID) | ORAL | Status: DC

## 2014-04-12 NOTE — Telephone Encounter (Signed)
Patient's spouse stated Rx gabapentin (NEURONTIN) 100 MG capsule isn't helping with Neuralgia Pain.  Questioning another medication for the pain.  Please forward Rx to [facility], [place] [address] [place], [place], phone # [phone] and Fax # [phone].

## 2014-04-12 NOTE — Telephone Encounter (Signed)
I called the patient, talk with the wife. The patient is on 200 mg 3 times daily of gabapentin, it is not helping his nerve pain. We will go to 300 mg 3 times a day, if this is not helping after one week, they are to contact me, we will go to taking a total of 4 capsules daily.

## 2014-08-16 ENCOUNTER — Telehealth: Payer: Self-pay | Admitting: Neurology

## 2014-08-16 NOTE — Telephone Encounter (Signed)
Patient's wife is calling in regard to needing an appt for her husband as he is a dementia patient who is having trouble walking, pain in back. Could you please work him in. Please call.

## 2014-08-17 NOTE — Telephone Encounter (Signed)
I called the patient's wife. Left a voicemail asking she call back.

## 2014-08-17 NOTE — Telephone Encounter (Signed)
I called the patient's wife. She wanted to schedule a follow up appointment with Dr. Jannifer Franklin as the patient hasn't been here since 01/03/14. Appointment scheduled for 09/07/14 and patient placed on wait list if a sooner appointment becomes available.

## 2014-08-17 NOTE — Telephone Encounter (Signed)
Patient's wife is returning a call.  

## 2014-08-23 ENCOUNTER — Ambulatory Visit: Payer: Self-pay | Admitting: Neurology

## 2014-09-07 ENCOUNTER — Ambulatory Visit: Payer: Self-pay | Admitting: Neurology

## 2014-10-03 ENCOUNTER — Ambulatory Visit: Payer: Self-pay | Admitting: Neurology

## 2014-10-13 ENCOUNTER — Telehealth: Payer: Self-pay | Admitting: *Deleted

## 2014-10-13 NOTE — Telephone Encounter (Signed)
I called the patient's wife. Appointment scheduled for 8/8.

## 2014-10-17 ENCOUNTER — Ambulatory Visit: Payer: Self-pay | Admitting: Neurology

## 2014-10-19 HISTORY — PX: PROSTATE BIOPSY: SHX241

## 2014-10-24 ENCOUNTER — Other Ambulatory Visit (HOSPITAL_COMMUNITY): Payer: Self-pay | Admitting: Urology

## 2014-10-24 DIAGNOSIS — C61 Malignant neoplasm of prostate: Secondary | ICD-10-CM

## 2014-10-31 ENCOUNTER — Ambulatory Visit (INDEPENDENT_AMBULATORY_CARE_PROVIDER_SITE_OTHER): Payer: Medicare HMO | Admitting: Neurology

## 2014-10-31 ENCOUNTER — Encounter: Payer: Self-pay | Admitting: Neurology

## 2014-10-31 VITALS — BP 122/70 | HR 60 | Ht 70.0 in

## 2014-10-31 DIAGNOSIS — G500 Trigeminal neuralgia: Secondary | ICD-10-CM | POA: Diagnosis not present

## 2014-10-31 DIAGNOSIS — R413 Other amnesia: Secondary | ICD-10-CM

## 2014-10-31 DIAGNOSIS — R269 Unspecified abnormalities of gait and mobility: Secondary | ICD-10-CM

## 2014-10-31 DIAGNOSIS — G63 Polyneuropathy in diseases classified elsewhere: Secondary | ICD-10-CM

## 2014-10-31 MED ORDER — TOPIRAMATE 25 MG PO TABS: ORAL_TABLET | ORAL | Status: DC

## 2014-10-31 NOTE — Progress Notes (Signed)
Reason for visit: Gait disorder  Jeremy Shea is an 81 y.o. male  History of present illness:  Jeremy Shea is an 81 year old right-handed white male with a history of a progressive memory disorder, and a history of left periorbital headaches. The patient reports a dull achy pain in this area, but he has sharp jabbing pains when he chews or swallows that comes up from the throat into the left periorbital area. The patient was placed on carbamazepine, but he developed an allergy to medication, and he was switched to gabapentin. The patient has tolerated gabapentin better, he takes 300 mg twice daily, but the medication does not completely control the pain. He has had significant worsening in his balance, he was found to have a peripheral neuropathy of moderate severity. He more recently was found to have aggressive prostate cancer, he has developed right-sided back pain with standing. He is to undergo a bone scan and CAT scan evaluation in the near future. He has developed fecal and urinary incontinence over the last several months. He now uses a wheelchair to get around. He has undergone physical, occupational, and speech therapy evaluation without much benefit. He returns to this office for an evaluation.  Past Medical History  Diagnosis Date  . Hypertension   . Stroke   . Prostate disorder   . Dementia   . Neuropathic pain of foot   . Complication of anesthesia     hard time waking up  . Memory disorder 12/22/2013  . Trigeminal neuralgia 12/22/2013  . Polyneuropathy in other diseases classified elsewhere 12/22/2013  . Abnormality of gait 12/22/2013  . Rib fracture   . Aspiration pneumonia     Past Surgical History  Procedure Laterality Date  . Appendectomy    . Joint replacement      Left shoulder, rotator cuff tear repair  . Cataract extraction Right   . Knee surgery Left     Family History  Problem Relation Age of Onset  . Heart attack Mother   . Heart failure Father   .  Heart attack Father   . Cancer - Lung Brother   . Prostate cancer Brother     Social history:  reports that he quit smoking about 36 years ago. His smoking use included Cigarettes. He smoked 2.00 packs per day. He has never used smokeless tobacco. He reports that he does not drink alcohol or use illicit drugs.    Allergies  Allergen Reactions  . Aspirin   . Penicillins   . Sulfa Antibiotics Other (See Comments)  . Carbamazepine Rash  . Codeine Anxiety and Other (See Comments)    Nervousness    Medications:  Prior to Admission medications   Medication Sig Start Date End Date Taking? Authorizing Provider  acetaminophen (TYLENOL) 500 MG tablet Take 1,000 mg by mouth every 6 (six) hours as needed for pain.   Yes Historical Provider, MD  clopidogrel (PLAVIX) 75 MG tablet Take 75 mg by mouth daily.   Yes Historical Provider, MD  fluticasone (FLONASE) 50 MCG/ACT nasal spray Place 1 spray into the nose daily as needed for rhinitis or allergies.    Yes Historical Provider, MD  gabapentin (NEURONTIN) 300 MG capsule Take 1 capsule (300 mg total) by mouth 3 (three) times daily. 04/12/14  Yes Kathrynn Ducking, MD  irbesartan (AVAPRO) 300 MG tablet  10/26/13  Yes Historical Provider, MD  loratadine (CLARITIN) 10 MG tablet Take 10 mg by mouth daily.   Yes Historical Provider,  MD  metoprolol tartrate (LOPRESSOR) 25 MG tablet Take 25 mg by mouth 2 (two) times daily.   Yes Historical Provider, MD  Multiple Vitamin (MULTIVITAMIN WITH MINERALS) TABS Take 1 tablet by mouth every morning.    Yes Historical Provider, MD  pantoprazole (PROTONIX) 40 MG tablet Take 40 mg by mouth 2 (two) times daily.    Yes Historical Provider, MD  rivastigmine (EXELON) 9.5 mg/24hr Place 13.3 patches onto the skin daily.    Yes Historical Provider, MD    ROS:  Out of a complete 14 system review of symptoms, the patient complains only of the following symptoms, and all other reviewed systems are negative.  Decreased activity  and appetite, fatigue hearing loss, difficulty swallowing Loss of vision Choking Cold intolerance Swollen abdomen, abdominal pain, incontinence of bowels Frequent waking, daytime sleepiness, snoring Difficulty urinating, incontinence of bladder, frequency of urination, decreased urine Back pain, walking difficulty Easy bruising, headache, speech difficulty, weakness Agitation, confusion, depression, anxiety   Blood pressure 122/70, pulse 60, height 5\' 10"  (1.778 m).  Physical Exam  General: The patient is alert and cooperative at the time of the examination.  Skin: No significant peripheral edema is noted.   Neurologic Exam  Mental status: The patient is alert and oriented x 3 at the time of the examination. The Mini-Mental Status Examination done today shows a total score 24/30. The patient is able to name 5 animals in 30 seconds.   Cranial nerves: Facial symmetry is present. Speech is normal, no aphasia or dysarthria is noted. Extraocular movements are full. Visual fields are full.  Motor: The patient has good strength in all 4 extremities, with exception of mild weakness of hip flexion bilaterally.  Sensory examination: Soft touch sensation is symmetric on the face, arms, and legs.  Coordination: The patient has good finger-nose-finger and heel-to-shin bilaterally.There is a stocking pattern pinprick sensory deficit up to the upper level of the thighs bilaterally.  Gait and station: The patient has a wide-based, very ataxic gait, tendency to fall backwards. The patient is not able to ambulate independently. Tandem gait was not attempted. Romberg is positive.  Reflexes: Deep tendon reflexes are symmetric, but are depressed.   MRI brain 01/03/2014:  Impression   Abnormal MRI brain (without) demonstrating: 1. Moderate perisylvian and severe mesial temporal atrophy. 2. Moderate ventriculomegaly. 3. Mild periventricular gliosis. 4. No acute findings. No significant change  from MRI 07/04/11.   Assessment/Plan:  1. Progressive gait disorder   2. Progressive memory disorder   3. Urinary, fecal incontinence   4. Peripheral neuropathy   5. Prostate cancer   6. Left periorbital headache   The patient has had significant progression of his ability to ambulate, he has aggressive prostate cancer, and onset of right-sided back pain, fecal incontinence and urinary incontinence. The patient will be set up for MRI evaluation of the lumbar and thoracic spine. He will be placed on Topamax to see if this helps the headache. He currently is on gabapentin taking 300 mg twice daily without full benefit. He will follow-up in 2-3 months.    Jill Alexanders MD 10/31/2014 7:47 PM  [facility] [address] [place], [place] [address]  Phone [phone] Fax [phone]

## 2014-10-31 NOTE — Patient Instructions (Addendum)
We will try a medication called Topamax for the headache pain. I will get you set up for MRI of the mid back and low back, I will call with the results. He will follow-up in about 2 or 3 months.  Topamax (topiramate) is a seizure medication that has an FDA approval for seizures and for migraine headache. Potential side effects of this medication include weight loss, cognitive slowing, tingling in the fingers and toes, and carbonated drinks will taste bad. If any significant side effects are noted on this drug, please contact our office.  Fall Prevention and Home Safety Falls cause injuries and can affect all age groups. It is possible to use preventive measures to significantly decrease the likelihood of falls. There are many simple measures which can make your home safer and prevent falls. OUTDOORS  Repair cracks and edges of walkways and driveways.  Remove high doorway thresholds.  Trim shrubbery on the main path into your home.  Have good outside lighting.  Clear walkways of tools, rocks, debris, and clutter.  Check that handrails are not broken and are securely fastened. Both sides of steps should have handrails.  Have leaves, snow, and ice cleared regularly.  Use sand or salt on walkways during winter months.  In the garage, clean up grease or oil spills. BATHROOM  Install night lights.  Install grab bars by the toilet and in the tub and shower.  Use non-skid mats or decals in the tub or shower.  Place a plastic non-slip stool in the shower to sit on, if needed.  Keep floors dry and clean up all water on the floor immediately.  Remove soap buildup in the tub or shower on a regular basis.  Secure bath mats with non-slip, double-sided rug tape.  Remove throw rugs and tripping hazards from the floors. BEDROOMS  Install night lights.  Make sure a bedside light is easy to reach.  Do not use oversized bedding.  Keep a telephone by your bedside.  Have a firm  chair with side arms to use for getting dressed.  Remove throw rugs and tripping hazards from the floor. KITCHEN  Keep handles on pots and pans turned toward the center of the stove. Use back burners when possible.  Clean up spills quickly and allow time for drying.  Avoid walking on wet floors.  Avoid hot utensils and knives.  Position shelves so they are not too high or low.  Place commonly used objects within easy reach.  If necessary, use a sturdy step stool with a grab bar when reaching.  Keep electrical cables out of the way.  Do not use floor polish or wax that makes floors slippery. If you must use wax, use non-skid floor wax.  Remove throw rugs and tripping hazards from the floor. STAIRWAYS  Never leave objects on stairs.  Place handrails on both sides of stairways and use them. Fix any loose handrails. Make sure handrails on both sides of the stairways are as long as the stairs.  Check carpeting to make sure it is firmly attached along stairs. Make repairs to worn or loose carpet promptly.  Avoid placing throw rugs at the top or bottom of stairways, or properly secure the rug with carpet tape to prevent slippage. Get rid of throw rugs, if possible.  Have an electrician put in a light switch at the top and bottom of the stairs. OTHER FALL PREVENTION TIPS  Wear low-heel or rubber-soled shoes that are supportive and fit well. Wear  closed toe shoes.  When using a stepladder, make sure it is fully opened and both spreaders are firmly locked. Do not climb a closed stepladder.  Add color or contrast paint or tape to grab bars and handrails in your home. Place contrasting color strips on first and last steps.  Learn and use mobility aids as needed. Install an electrical emergency response system.  Turn on lights to avoid dark areas. Replace light bulbs that burn out immediately. Get light switches that glow.  Arrange furniture to create clear pathways. Keep furniture  in the same place.  Firmly attach carpet with non-skid or double-sided tape.  Eliminate uneven floor surfaces.  Select a carpet pattern that does not visually hide the edge of steps.  Be aware of all pets. OTHER HOME SAFETY TIPS  Set the water temperature for 120 F (48.8 C).  Keep emergency numbers on or near the telephone.  Keep smoke detectors on every level of the home and near sleeping areas. Document Released: 03/01/2002 Document Revised: 09/10/2011 Document Reviewed: 05/31/2011 [facility] Patient Information 2015 [place], [place]. This information is not intended to replace advice given to you by your health care provider. Make sure you discuss any questions you have with your health care provider.

## 2014-11-04 ENCOUNTER — Encounter (HOSPITAL_COMMUNITY)
Admission: RE | Admit: 2014-11-04 | Discharge: 2014-11-04 | Disposition: A | Discharge status: Home / Self Care | Payer: Medicare HMO | Source: Ambulatory Visit | Attending: Urology | Admitting: Urology

## 2014-11-04 DIAGNOSIS — C61 Malignant neoplasm of prostate: Secondary | ICD-10-CM | POA: Diagnosis present

## 2014-11-04 MED ORDER — TECHNETIUM TC 99M MEDRONATE IV KIT
27.1000 | PACK | Freq: Once | INTRAVENOUS | Status: AC | PRN
  Administered 2014-11-04: 27.1 via INTRAVENOUS

## 2014-11-08 ENCOUNTER — Ambulatory Visit
Admission: RE | Admit: 2014-11-08 | Discharge: 2014-11-08 | Disposition: A | Discharge status: Home / Self Care | Payer: Medicare HMO | Source: Ambulatory Visit | Attending: Neurology | Admitting: Neurology

## 2014-11-08 ENCOUNTER — Other Ambulatory Visit: Payer: Self-pay | Admitting: Urology

## 2014-11-08 DIAGNOSIS — R269 Unspecified abnormalities of gait and mobility: Secondary | ICD-10-CM | POA: Diagnosis not present

## 2014-11-08 DIAGNOSIS — G63 Polyneuropathy in diseases classified elsewhere: Secondary | ICD-10-CM

## 2014-11-08 DIAGNOSIS — R413 Other amnesia: Secondary | ICD-10-CM

## 2014-11-08 DIAGNOSIS — G500 Trigeminal neuralgia: Secondary | ICD-10-CM

## 2014-11-09 ENCOUNTER — Telehealth: Payer: Self-pay | Admitting: Medical Oncology

## 2014-11-09 NOTE — Telephone Encounter (Signed)
Oncology Nurse Navigator Documentation  Oncology Nurse Navigator Flowsheets 11/09/2014  Referral date to RadOnc/MedOnc 11/09/2014  Navigator Encounter Type Introductory phone call- I called pt to introduce myself as the Prostate Nurse Navigator and the Coordinator of the Prostate [place].  1. I confirmed with the patient he is aware of his referral to the clinic August 23,2016 arriving at 12:15pm.  2. I discussed the format of the clinic and the physicians he will be seeing that day. I asked them to have lunch before they come due to the length of the clinic.  3. I discussed where the clinic is located and how to contact me.  4. I confirmed his address and informed him I would be mailing a packet of information and forms to be completed. I asked him to bring them with him the day of his appointment.   He voiced understanding of the above. I asked him to call me if he has any questions or concerns regarding his appointments or the forms he needs to complete. I gave them my office number and asked them to call me with any questions or concerns. I informed them I will call them on Monday to confirm appointment. She voiced understanding.  Time Spent with Patient 15

## 2014-11-10 ENCOUNTER — Telehealth: Payer: Self-pay | Admitting: Neurology

## 2014-11-10 NOTE — Telephone Encounter (Signed)
I called the patient. The patient appears to have some cord compression at the T7-8 level, some signal change in the cord. This could explain the walking problems in bowel and bladder incontinence. The patient has not yet had the MRI of the low back, he will get this done. I am not sure that the patient is a surgery candidate given his prostate cancer, advanced age, and dementia. I will call the patient once the MRI of the low back is done.   Thoracic spine MRI 11/10/2014:  IMPRESSION:  Abnormal MRI thoracic spine (without) demonstrating: 1. At T7-8: small disc protrusion, posterior epidural lipomatosis, resulting in moderate-severe spinal stenosis; possible subtle T2 and STIR hyperintense signal at this level (and down to T8-9 level) may represent edema or gliosis 2. At T5-6: ligamentum flavum hypertrophy, posterior epidural lipomatosis, resulting in moderate spinal stenosis; no cord signal abnormality at this level 3. Significant posterior epidural lipomatosis from T1 to T12 levels, which displaces the spinal cord anteriorly. The spinal cord also appears atrophic from T3-4 to T10 levels. 4. Anterior spondylosis and bone bridging with flowing ossifications from C3 to C6 (noted on scout views).  5. No evidence of metastatic bone lesions.

## 2014-11-14 ENCOUNTER — Telehealth: Payer: Self-pay | Admitting: Medical Oncology

## 2014-11-14 ENCOUNTER — Encounter: Payer: Self-pay | Admitting: Radiation Oncology

## 2014-11-14 NOTE — Progress Notes (Signed)
GU Location of Tumor / Histology:Adenocarcinoma of the Prostate    If Prostate Cancer, Gleason Score is (5 + 5) and PSA is (30.37)  Jeremy Shea presented to [facility] on 10/11/14 with c/o of a prostate nodule an difficulty voiding and hx of mild lower urinary tract symptoms which progressively worse.  After hospitilization for aspiration pneumonia he had worsening symptoms inclusive of a sense of incomplete emptying, urinary frequency, intermittency, urgency, weak stream, straining, nocturia and incontinence.  He has a hx of recurrent prostatitis and has received treatment in the past with Fluoroquinolone antibiotica under the care of fo Dr. Hamilton Capri in [place],  [place].  IPSS 27  Past/Anticipated interventions by urology, if any: Prostate Biopsy  Past/Anticipated interventions by medical oncology, if any: Unkown  Weight changes, if any: None  Bowel/Bladder complaints, if any: IPSS-27 as of 10/11/14   Nausea/Vomiting, if any:   Pain issues, if any:    SAFETY ISSUES:  Prior radiation? No  Pacemaker/ICD? No  Possible current pregnancy? N/A  Is the patient on methotrexate? No  Current Complaints / other details:

## 2014-11-14 NOTE — Telephone Encounter (Signed)
Oncology Nurse Navigator Documentation  Oncology Nurse Navigator Flowsheets 11/09/2014 11/14/2014  Referral date to RadOnc/MedOnc 11/09/2014 11/14/2014  Navigator Encounter Type Introductory phone call Telephone- Left a message for Mr. Ells to remind him of his appointment 11/15/2014 for the Prostate [place], arriving at 12:15pm. I asked them to bring the completed medical forms and to have lunch before they come. I asked them to call me back if they have questions or concerns.  Time Spent with Patient 15 15

## 2014-11-15 ENCOUNTER — Ambulatory Visit (HOSPITAL_BASED_OUTPATIENT_CLINIC_OR_DEPARTMENT_OTHER): Payer: Medicare HMO | Admitting: Oncology

## 2014-11-15 ENCOUNTER — Encounter: Payer: Self-pay | Admitting: Medical Oncology

## 2014-11-15 ENCOUNTER — Ambulatory Visit
Admission: RE | Admit: 2014-11-15 | Discharge: 2014-11-15 | Disposition: A | Discharge status: Home / Self Care | Payer: Medicare HMO | Source: Ambulatory Visit | Attending: Radiation Oncology | Admitting: Radiation Oncology

## 2014-11-15 ENCOUNTER — Encounter: Payer: Self-pay | Admitting: Radiation Oncology

## 2014-11-15 VITALS — BP 105/42 | HR 48 | Temp 97.7°F | Ht 70.0 in

## 2014-11-15 DIAGNOSIS — C61 Malignant neoplasm of prostate: Secondary | ICD-10-CM | POA: Diagnosis not present

## 2014-11-15 DIAGNOSIS — G629 Polyneuropathy, unspecified: Secondary | ICD-10-CM | POA: Diagnosis not present

## 2014-11-15 HISTORY — DX: Personal history of other diseases of the digestive system: Z87.19

## 2014-11-15 HISTORY — DX: History of falling: Z91.81

## 2014-11-15 HISTORY — DX: Personal history of peptic ulcer disease: Z87.11

## 2014-11-15 HISTORY — DX: Personal history of other mental and behavioral disorders: Z86.59

## 2014-11-15 HISTORY — DX: Personal history of urinary calculi: Z87.442

## 2014-11-15 NOTE — Progress Notes (Signed)
[facility] NEW PATIENT EVALUATION  Name: Jeremy Shea MRN: [id number]  Date:   11/15/2014           DOB: 1930-10-10  Status: outpatient   CC: [name], MD  Raynelle Bring, MD    REFERRING PHYSICIAN: Raynelle Bring, MD   DIAGNOSIS:  Clinical stage T3a N1 M0 high risk adenocarcinoma prostate.  HISTORY OF PRESENT ILLNESS:  Jeremy Shea is a 81 y.o. male who is seen today through the courtesy of Dr. Dutch Gray at the prostate multidisciplinary clinic for evaluation of his clinical stage T3a N1 M0 high risk adenocarcinoma prostate.  He presented to his local urologist and Dorth[facility] Rue [facility] for evaluation of right flank discomfort and also worsening lower urinary tract symptoms.  He was noted to have right sided prostatic nodularity.  His PSA was found to be elevated at 40.4.  He underwent ultrasound-guided biopsies on July 27 by Dr. Alinda Money which showed extensive adenocarcinoma prostate including Gleason 10 (5+5) involving 50% of one core from the right lateral apex.  He also had Gleason 9 (5+4) involving 95% of one core from the right mid gland, 95% of one core from the right apex.  He had Gleason 9 (4+5) involving 95% of one core from the right lateral base, and 90% of one core from right lateral mid gland.  He had Gleason 7 (3+4) involving 90% of one core from the left lateral mid gland and Gleason 7 (4+3) involving 95% of one core from the right base.  Gleason's 6 was seen to involve 10% of one core from the left base.  Prostate volume was 75.4 mL.  His staging workup included a CT scan of the abdomen/pelvis on August 12 which did show an enlarged prostate with an enlarged right pelvic sidewall lymph node and other non-enlarged lymph nodes along the left pelvic sidewall.  There was also a 10 x 8 x 12 mm right UPJ stone.  A bone scan was without evidence for metastatic disease.  He has been undergoing neurologic evaluation for a peripheral neuropathy.  He has  history of a CVA in 2000.  His wife feels that he is been becoming gradually demented since surgery for a left shoulder replacement in 2011.  His neurologic evaluation is not yet completed.  His I PSS score today is 32.  PREVIOUS RADIATION THERAPY: No   PAST MEDICAL HISTORY:  has a past medical history of Hypertension; Stroke (2000); Prostate disorder; Dementia; Neuropathic pain of foot; Complication of anesthesia; Memory disorder (12/22/2013); Trigeminal neuralgia (12/22/2013); Polyneuropathy in other diseases classified elsewhere (12/22/2013); Abnormality of gait (12/22/2013); Rib fracture; Aspiration pneumonia; At risk for injury related to fall; anxiety disorder; major depression; History of stomach ulcers; and History of kidney stones.     PAST SURGICAL HISTORY:  Past Surgical History  Procedure Laterality Date  . Appendectomy    . Joint replacement      Left shoulder, rotator cuff tear repair  . Cataract extraction Right   . Knee surgery Left   . Prostate biopsy  10/19/14     FAMILY HISTORY: family history includes Cancer - Lung in his brother; Heart attack in his father and mother; Heart failure in his father; Prostate cancer in his brother and brother.   He has 2 brothers who were diagnosed with prostate cancer at age 67 and also 75.  He believes that his paternal grandfather was also diagnosed with prostate cancer.  SOCIAL HISTORY:  reports that he  quit smoking about 36 years ago. His smoking use included Cigarettes. He smoked 2.00 packs per day. He has never used smokeless tobacco. He reports that he does not drink alcohol or use illicit drugs.  Married, 2 children.   ALLERGIES: Aspirin; Penicillins; Sulfa antibiotics; Carbamazepine; and Codeine   MEDICATIONS:  Current Outpatient Prescriptions  Medication Sig Dispense Refill  . acetaminophen (TYLENOL) 500 MG tablet Take 1,000 mg by mouth every 6 (six) hours as needed for pain.    Marland Kitchen clopidogrel (PLAVIX) 75 MG tablet Take 75 mg by  mouth daily.    . fluticasone (FLONASE) 50 MCG/ACT nasal spray Place 1 spray into the nose daily as needed for rhinitis or allergies.     Marland Kitchen gabapentin (NEURONTIN) 300 MG capsule Take 1 capsule (300 mg total) by mouth 3 (three) times daily. 90 capsule 2  . irbesartan (AVAPRO) 300 MG tablet daily.     Marland Kitchen loratadine (CLARITIN) 10 MG tablet Take 10 mg by mouth daily.    . metoprolol tartrate (LOPRESSOR) 25 MG tablet Take 25 mg by mouth 2 (two) times daily.    . Multiple Vitamin (MULTIVITAMIN WITH MINERALS) TABS Take 1 tablet by mouth every morning.     . pantoprazole (PROTONIX) 40 MG tablet Take 40 mg by mouth 2 (two) times daily.     . rivastigmine (EXELON) 9.5 mg/24hr Place 13.3 patches onto the skin daily.      No current facility-administered medications for this encounter.     REVIEW OF SYSTEMS:  Pertinent items are noted in HPI.    PHYSICAL EXAM:  height is 5\' 10"  (1.778 m). His temperature is 97.7 F (36.5 C). His blood pressure is 105/42 and his pulse is 48.   He is not examined today.   LABORATORY DATA:  Lab Results  Component Value Date   WBC 11.7* 09/06/2012   HGB 9.4* 09/06/2012   HCT 25.9* 09/06/2012   MCV 97.4 09/06/2012   PLT 199 09/06/2012   Lab Results  Component Value Date   NA 136 09/04/2012   K 4.2 09/04/2012   CL 102 09/04/2012   CO2 25 09/04/2012   No results found for: ALT, AST, GGT, ALKPHOS, BILITOT    IMPRESSION: Stage T3a N1 M0 high risk adenocarcinoma prostate.  I explained to the patient, his wife, and daughters that his prognosis is related to his stage, PSA level, and Gleason score.  All are unfavorable.  We discussed observation with androgen deprivation therapy alone versus external beam radiation therapy/IMRT along with androgen deprivation therapy.  If he is interested in radiation therapy, I would favor repeating a CT scan in 2-3 months.  If his suspected right pelvic lymph node has regressed then we can assume that he has N1 disease and then  have a conversation whether not he would derive benefit from radiation therapy considering the potential acute and late toxicities.  He should have completed his neurologic evaluation by then as well.  We discussed the potential acute and late toxicities of radiation therapy and also the side effects of androgen deprivation therapy.  The family also inquired about other potential therapies, and they are interested in perhaps visiting with Dr. Thayer Jew at  [facility].  Lastly, he will undergo lithotripsy for management of his right UPJ stone.   PLAN: As above.   I spent 45 minutes face to face with the patient and more than 50% of that time was spent in counseling and/or coordination of care.

## 2014-11-15 NOTE — Consult Note (Signed)
Reason for Referral: prostate cancer.  HPI: this is a pleasant 81 year old gentleman of [place] where he lived the majority of his life. He is retired from [employer] also was served in [employer] for about 8 years.he has a past medical history significant for arthritis, hypertension and previous stroke. He also has issues with progressive peripheral neuropathy that is predominantly motor and sensory in his lower extremities. He had been evaluated by Dr. Jannifer Franklin regarding this issue. He also had MRI of the thoracic and lumbar spine which showed evidence of spinal stenosis but no malignancy. He is started developing lower urinary tract symptoms including incomplete emptying, urgency and nocturia. He was evaluated by a local urologist and found to have a worrisome digital rectal examination. He subsequently was evaluated by Dr. Alinda Money and found to have an elevated PSA up to 40. He underwent a biopsy in July 2016 which showed a Gleason score 5+5 = 10 and 8 out of 12 cores. Staging workup did not reveal any bony metastasis but does report pelvic adenopathy measuring 10 x 18 mm in the external iliac lymph node area. Patient was referred to prostate cancer multidisciplinary clinic for evaluation. Clinically, he does report symptoms of progressive lower extremity weakness and unable to ambulate without the help of a walker or wheelchair. He also has reported some headaches and some visual disturbances at times. He does not report any seizures or syncope.  He does not report any fevers, chills, sweats or weight loss or appetite changes. He does not report any chest pain, palpitation, orthopnea or leg edema. He does not report any cough, wheezing, hemoptysis or dyspnea on exertion. He does not report any nausea, vomiting, abdominal pain, hematochezia or melanoma. He does not report any skeletal complaints. He does not report any lymphadenopathy or petechiae. Remaining review of systems  unremarkable.  Past Medical History  Diagnosis Date  . Hypertension   . Stroke 2000  . Prostate disorder   . Dementia   . Neuropathic pain of foot     numbness and weakness of lower extremities  . Complication of anesthesia     hard time waking up  . Memory disorder 12/22/2013  . Trigeminal neuralgia 12/22/2013  . Polyneuropathy in other diseases classified elsewhere 12/22/2013  . Abnormality of gait 12/22/2013  . Rib fracture   . Aspiration pneumonia   . At risk for injury related to fall   . Hx of anxiety disorder   . Hx of major depression   . History of stomach ulcers   . History of kidney stones     40 years ago  :  Past Surgical History  Procedure Laterality Date  . Appendectomy    . Joint replacement      Left shoulder, rotator cuff tear repair  . Cataract extraction Right   . Knee surgery Left   . Prostate biopsy  10/19/14  :   Current outpatient prescriptions:  .  acetaminophen (TYLENOL) 500 MG tablet, Take 1,000 mg by mouth every 6 (six) hours as needed for pain., Disp: , Rfl:  .  clopidogrel (PLAVIX) 75 MG tablet, Take 75 mg by mouth daily., Disp: , Rfl:  .  fluticasone (FLONASE) 50 MCG/ACT nasal spray, Place 1 spray into the nose daily as needed for rhinitis or allergies. , Disp: , Rfl:  .  gabapentin (NEURONTIN) 300 MG capsule, Take 1 capsule (300 mg total) by mouth 3 (three) times daily., Disp: 90 capsule, Rfl: 2 .  irbesartan (AVAPRO)  300 MG tablet, daily. , Disp: , Rfl:  .  loratadine (CLARITIN) 10 MG tablet, Take 10 mg by mouth daily., Disp: , Rfl:  .  metoprolol tartrate (LOPRESSOR) 25 MG tablet, Take 25 mg by mouth 2 (two) times daily., Disp: , Rfl:  .  Multiple Vitamin (MULTIVITAMIN WITH MINERALS) TABS, Take 1 tablet by mouth every morning. , Disp: , Rfl:  .  pantoprazole (PROTONIX) 40 MG tablet, Take 40 mg by mouth 2 (two) times daily. , Disp: , Rfl:  .  rivastigmine (EXELON) 9.5 mg/24hr, Place 13.3 patches onto the skin daily. , Disp: , Rfl:  :  Allergies  Allergen Reactions  . Aspirin   . Penicillins   . Sulfa Antibiotics Other (See Comments)  . Carbamazepine Rash  . Codeine Anxiety and Other (See Comments)    Nervousness  :  Family History  Problem Relation Age of Onset  . Heart attack Mother   . Heart failure Father   . Heart attack Father   . Cancer - Lung Brother   . Prostate cancer Brother   . Prostate cancer Brother   :  Social History   Social History  . Marital Status: Married    Spouse Name: Arville Go  . Number of Children: 2  . Years of Education: 12+   Occupational History  . retired     Social History Main Topics  . Smoking status: Former Smoker -- 2.00 packs/day    Types: Cigarettes    Quit date: 09/05/1978  . Smokeless tobacco: Never Used  . Alcohol Use: No  . Drug Use: No  . Sexual Activity: No   Other Topics Concern  . Not on file   Social History Narrative   Patient lives at home [place] with wife.    Patient has 2 children.   Patient is right handed    Patient has 4 years of college.    Patient drinks about 3 cups of caffeine daily.  :  Pertinent items are noted in HPI.  Exam: There were no vitals taken for this visit. General appearance: alert and cooperative Head: Normocephalic, without obvious abnormality Throat: lips, mucosa, and tongue normal; teeth and gums normal Neck: no adenopathy Back: negative Resp: clear to auscultation bilaterally Chest wall: no tenderness Cardio: regular rate and rhythm, S1, S2 normal, no murmur, click, rub or gallop GI: soft, non-tender; bowel sounds normal; no masses,  no organomegaly Extremities: extremities normal, atraumatic, no cyanosis or edema Pulses: 2+ and symmetric Skin: Skin color, texture, turgor normal. No rashes or lesions Lymph nodes: Cervical, supraclavicular, and axillary nodes normal.  CBC    Component Value Date/Time   WBC 11.7* 09/06/2012 0443   RBC 2.66* 09/06/2012 0443   HGB 9.4* 09/06/2012 0443   HCT 25.9*  09/06/2012 0443   PLT 199 09/06/2012 0443   MCV 97.4 09/06/2012 0443   MCH 35.3* 09/06/2012 0443   MCHC 36.3* 09/06/2012 0443   RDW 13.4 09/06/2012 0443   LYMPHSABS 4.2* 09/06/2012 0443   MONOABS 1.4* 09/06/2012 0443   EOSABS 0.2 09/06/2012 0443   BASOSABS 0.1 09/06/2012 0443      Chemistry      Component Value Date/Time   NA 136 09/04/2012 1708   K 4.2 09/04/2012 1708   CL 102 09/04/2012 1708   CO2 25 09/04/2012 1708   BUN 23 09/04/2012 1708   CREATININE 0.85 09/04/2012 2245      Component Value Date/Time   CALCIUM 8.8 09/04/2012 1708  Mr Thoracic Spine Wo Contrast  11/10/2014   [facility]  NEUROIMAGING REPORT   STUDY DATE: 11/08/14 PATIENT NAME: Jeremy Shea DOB: 07-25-1930 MRN: [id number]  ORDERING CLINICIAN: Kathrynn Ducking, MD  CLINICAL HISTORY: 81 year old male with gait difficulty and incontinence.  History of prostate cancer.  EXAM: MRI thoracic spine (without)  TECHNIQUE: MRI of the thoracic spine was obtained utilizing 3 mm sagittal  slices from T7-0 down to the L1-2 level with T1, T2 and inversion recovery  views. In addition 4 mm axial slices from V7-B9 down to T12-L1 level were  included with T1, T2 and gradient echo views.   CONTRAST: no IMAGING SITE: [facility] [address] (1.5 Tesla MRI)    FINDINGS:  On sagittal views the vertebral bodies have normal height and alignment.  Significant anterior kyphosis of upper thoracic spine. Anterior  spondylosis and bone bridging with flowing ossifications from C3 to C6  (noted on scout views). Significant posterior epidural lipomatosis from T1  to T12 levels, which displaces the spinal cord anteriorly. The cord  appears atrophic from T3-4 to T10 levels. The paraspinal soft tissues are  unremarkable.    On axial views: C7-T1 to T2-3: no spinal stenosis or foraminal narrowing T3-4, T4-5: posterior epidural lipomatosis with mild spinal stenosis T5-6: ligamentum flavum hypertrophy, posterior  epidural lipomatosis,  resulting in moderate spinal stenosis T6-7: posterior epidural lipomatosis with mild spinal stenosis T7-8: small disc protrusion, posterior epidural lipomatosis, resulting in  moderate-severe spinal stenosis; possible subtle T2 hyperintense signal at  this level T8-9: posterior epidural lipomatosis with no spinal stenosis or foraminal  narrowing; possible subtle T2 hyperintense signal extending down to this  level T9-10: no spinal stenosis or foraminal narrowing T10-11: facet hypertrophy with no spinal stenosis or foraminal narrowing  T11-12: facet hypertrophy with no spinal stenosis or foraminal narrowing  T12-L1: no spinal stenosis or foraminal narrowing    Limited views of the aorta, kidneys, liver, lungs and paraspinal muscles  are unremarkable.    11/10/2014   Abnormal MRI thoracic spine (without) demonstrating: 1. At T7-8: small disc protrusion, posterior epidural lipomatosis,  resulting in moderate-severe spinal stenosis; possible subtle T2 and STIR  hyperintense signal at this level (and down to T8-9 level) may represent  edema or gliosis 2. At T5-6: ligamentum flavum hypertrophy, posterior epidural lipomatosis,  resulting in moderate spinal stenosis; no cord signal abnormality at this  level 3. Significant posterior epidural lipomatosis from T1 to T12 levels, which  displaces the spinal cord anteriorly. The spinal cord also appears  atrophic from T3-4 to T10 levels. 4. Anterior spondylosis and bone bridging with flowing ossifications from  C3 to C6 (noted on scout views).  5. No evidence of metastatic bone lesions.    INTERPRETING PHYSICIAN:  Penni Bombard, MD Certified in Neurology, Neurophysiology and Neuroimaging  [facility] [address], [place], [place] [address] [phone]   Nm Bone Scan Whole Body  11/04/2014   CLINICAL DATA:  Current history of prostate cancer with elevated PSA 40.37. Surgical history includes left shoulder arthroplasty  an unspecified left knee surgery.  EXAM: NUCLEAR MEDICINE WHOLE BODY BONE SCAN  TECHNIQUE: Whole body anterior and posterior images were obtained approximately 3 hours after intravenous injection of radiopharmaceutical.  RADIOPHARMACEUTICALS:  27.1 mCi Technetium-75m MDP IV  COMPARISON:  No prior nuclear imaging. Bone window images from CT abdomen and pelvis performed earlier same date are correlated.  FINDINGS: Activity projecting to the right of  the L1 and L2 vertebrae and to the left of the L2 and L3 vertebrae correspond to large bridging osteophytes at these levels. Degenerative activity is identified in the right acromioclavicular joint and in both sternoclavicular joints. No abnormal activity is identified to suggest osseous metastatic disease. Photopenia is present in the proximal left humerus related to the shoulder prosthesis. Non-osseous activity involving the right shoulder may within the capsule or bursa.  Expected excretion is present in the urinary tract. Activity below the pelvic bone is consistent with urinary incontinence. Activity in the anterior neck is consistent with uptake of free pertechnetate by the thyroid gland.  IMPRESSION: 1. No evidence of osseous metastatic disease. 2. Degenerative uptake in the right acromioclavicular joint, both sternoclavicular joints and in bridging lateral osteophytes at L1-2 and L2-3.   Electronically Signed   By: Evangeline Dakin M.D.   On: 11/04/2014 15:23    Assessment and Plan:   81 year old gentleman with the following issues:  1. Prostate cancer diagnosed in July 2016 after presenting with lower urinary tract symptoms, PSA 40 and a biopsy showed a Gleason score 5+5 = 10 with cancer involvement and 8 out of 12 cores. His case was discussed today and the prostate cancer multidisciplinary clinic. Imaging studies including CT scan and MRIs were discussed with reviewing radiologist. He is pathology was also discussed with the reviewing pathologist.  It  is likely that he has metastatic disease and at worselocally advanced disease. The natural course of this disease was discussed today in detail with the patient and his family. Options of treatment were reviewed and it is very likely that the goal of treatment would be palliative rather than curative. Surgery is not an option in his case given the fact he has locally advanced disease and the fact that he has multiple comorbid conditions associated with advanced age.  Hormone therapy is the mainstay of treatment at this time consistent of Lupron or surgical castration. Complications associated with this therapy including hot flashes, erectile dysfunction, weight gain, osteoporosis and rarely cardiovascular complications. After a few months of therapy, repeat imaging studies to assess his lymphadenopathy can help differentiate whether indeed we are dealing with stage IV disease. If that is the case and his lymphadenopathy improves, radiation therapy might offer very little at that time.  If his lymphadenopathy does not respond to hormone therapy, then we are likely dealing with locally advanced disease and radiation therapy has been known to improve survival.  The role of systemic chemotherapy was discussed today and has limited role in this particular setting. I would probably avoid systemic chemotherapy in him given his peripheral neuropathy.  2. Polyneuropathy: Remains idiopathic at this time. I do not think this is related to his malignancy. Paraneoplastic syndromes have been described previously with multiple malignancies predominantly small cell cancer of the genitourinary tract. It remains a possibility but unlikely.  All his questions and his family's questions were answered to their satisfaction.

## 2014-11-15 NOTE — Consult Note (Signed)
Reason For Visit  Location of consult: [facility]   History of Present Illness  Jeremy Shea is 81 years old with the following urologic history:  1) Prostate cancer: He presented in July 2016 with worsening LUTS, right flank pain, an elevated PSA, and a worrisome DRE with concern for right sided palpable malignancy with extraprostatic extension. His PSA was noted to be persistently elevated at 40.4.  He underwent a prostate needle biopsy on 10/19/14 that confirmed Gleason 5+5=10 adenocarcinoma of the prostate with 8 out of 12 biopsy cores (including all 6 right sided biopsies) positive for malignancy.  He has a history of prostatitis.  He has strong family history of prostate cancer with 2 brothers having had prostate cancer.  Staging studies were performed including a bone scan and CT scan.  His bone scan on 11/04/14 was negative for metastatic disease.  His CT scan did show a 1.8 cm right external iliac lymph node that was suspicious.  2) BPH/LUTS:  He has developed worsening LUTS including urinary frequency, intermittency, urgency, weak stream, straining, and nocturia.  He also developed incontinence which has been unconscious without clear stress or urge incontinence.  Baseline IPSS was 27.  Baseline PVR was 198 cc.  3) Urolithiasis: He initially presented to me in July 2016 with complaints of LUTS but also had right flank pain.  He was diagnosed with prostate cancer and incidentally found to have a right UPJ calculus on his staging CT scan.  ** His past medical history is significant for a stroke in 2000 (chronically takes Plavix), neuropathy of unclear etiology (followed by [facility] which causes weakness and numbness of his lower extremities and resulting difficulty with ambulation.   Interval history:  Jeremy Shea follows up today for further discussion regarding his right proximal ureteral calculus, high-risk prostate cancer,  and lower urinary tract symptoms/incontinence.  He has not been having severe pain fortunately since his last visit on 8/16.  He continues to have similar urinary complaints including unconscious incontinence although his PVR has only been moderate.  He has continued to undergo further evaluation of his lower extremity peripheral neuropathy.  He did have a thoracic MRI since his last visit which we did review in conference today.  However, he could not lie flat long enough to also proceed with his lumbar MRI which is in the process of being rescheduled.     Past Medical History  1. History of Anxiety and depression (F41.9,F32.9)  2. History of arthritis (Z87.39)  3. History of heartburn (Z87.898)  4. History of hypertension (Z86.79)  5. History of peripheral neuropathy (Z86.69)  6. History of stomach ulcers (Z87.19)  7. History of stroke (I34.74)  Surgical History  1. History of Appendectomy  2. History of Knee Surgery  3. History of Shoulder Surgery Left  Current Meds  1. Avapro 300 MG Oral Tablet;  Therapy: (Recorded:19Jul2016) to Recorded  2. Claritin 10 MG Oral Tablet;  Therapy: (Recorded:19Jul2016) to Recorded  3. Exelon 9.5 MG/24HR Transdermal Patch 24 Hour;  Therapy: (Recorded:19Jul2016) to Recorded  4. Fluticasone Propionate 50 MCG/ACT Nasal Suspension;  Therapy: (Recorded:19Jul2016) to Recorded  5. Gabapentin 100 MG TABS;  Therapy: (Recorded:19Jul2016) to Recorded  6. Metoprolol Succinate ER 25 MG Oral Tablet Extended Release 24 Hour;  Therapy: (Recorded:19Jul2016) to Recorded  7. Multi Vitamin/Minerals Oral Tablet;  Therapy: (Recorded:19Jul2016) to Recorded  8. Pantoprazole Sodium 40 MG Oral Tablet Delayed Release;  Therapy: (Recorded:19Jul2016) to Recorded  9. Plavix  75 MG Oral Tablet;  Therapy: (Recorded:19Jul2016) to Recorded  Allergies  1. Aspirin Low Dose TABS  2. Codeine Derivatives  3. Sulfa Drugs  Family History  1. Family history of Death of family member  : Mother, Father  2. Family history of heart failure (Z82.49) : Mother, Father  3. Family history of prostate cancer (Z80.42) : Brother, Merchant navy officer  Social History   Denied: History of Alcohol use   Former smoker [phone])   Married   Number of children   Retired  Physical Exam Constitutional: Well nourished and well developed . No acute distress.    Results/Data  I have reviewed his medical records, pathology report, PSA results, and imaging reports today.  Furthermore, we had independently reviewed his pathology slides, CT scan, and bone scan findings in conjunction with radiology and pathology.  Findings are as dictated above.     Assessment  1. Prostate cancer (C61)  Discussion/Summary  1.  Right proximal ureteral calculus: He is scheduled to proceed with ESWL treatment on Thursday.  We again reviewed the indication for this procedure and expected recovery process as well as potential complications.  He and his family are agreeable to proceed as planned.  He has scheduled follow-up with a KUB and renal ultrasound in mid September.  2.  Probable metastatic prostate cancer: He has been evaluated by both Dr. Valere Dross and Dr. Alen Blew earlier this afternoon.  It is the consensus of the multidisciplinary clinic physicians to proceed with systemic therapy with androgen deprivation therapy with plans to reimage his abdomen and pelvis in 2-3 months.  If he does have regression of his lymph nodes indicating that these are likely consistent with prostate cancer metastasis with an appropriate response to therapy, Dr. Valere Dross does not feel that radiation therapy would likely be beneficial and would recommend continued systemic treatment.  He does have a persistent enlarged lymph node, he may benefit from biopsy to rule out metastatic disease and that may be a candidate for curative therapy.  We have reviewed androgen deprivation therapy today in detail.We discussed the risks and benefits of  androgen deprivation therapy. Side effects of prolonged use of androgen deprivation were discussed including hot flashes, fracture and depletion of bone mineral density, weight gain, decreased libido, cardiovascular risks such as increased risk of diabetes, abdominal girth, elevated cholesterol, and possibly cardiovascular events such as heart attack or stroke. We discussed intermittent androgen deprivation as an alternative approach to continuous therapy as well as combined androgen blockade.  It was explained that intermittent therapy has not been shown to improve survival but in certain populations of men has demonstrated similar survival rates with improved quality of life outcomes. It was also explained that combined androgen blockade may provide a marginal survival benefit as compare with sequential use of anti-androgens but with the risks of additional mild toxicities and increased cost.  He plans to begin treatment in the near future following recovery from his kidney stone procedure.  Our current plan would be to begin at his follow-up appointment in mid September unless he does wish to proceed sooner.  3.  LUTS/incontinence: The etiology of his symptoms are somewhat unclear.  He is scheduled undergo a lumbar MRI which may be helpful as far as determining whether he may have a neurogenic bladder.  Following recovery from his kidney stone procedure and after beginning systemic therapy for his metastatic prostate cancer, we will discuss having him undergo a urodynamic evaluation for further evaluation to help direct appropriate therapy.  Currently, the symptoms are his most bothersome and are greatly affecting his quality of life.  Cc: Dr. [name] - [place], [facility] Dr. Floyde Parkins   A total of 45 minutes were spent in the overall care of the patient today with 45 minutes in direct face to face consultation.    Signatures Electronically signed by : Raynelle Bring, M.D.; Nov 15 2014   5:37PM EST

## 2014-11-15 NOTE — Progress Notes (Signed)
Please see consult note.  

## 2014-11-15 NOTE — Addendum Note (Signed)
Encounter addended by: Raynelle Bring, MD on: 11/15/2014  5:44 PM<BR>     Documentation filed: Clinical Notes

## 2014-11-15 NOTE — Progress Notes (Signed)
Oncology Nurse Navigator Documentation  Oncology Nurse Navigator Flowsheets 11/09/2014 11/14/2014 11/15/2014  Referral date to RadOnc/MedOnc 11/09/2014 11/14/2014 -  Navigator Encounter Type Introductory phone call Telephone Clinic/MDC  Time Spent with Patient 15 15 30                                  Care Plan Summary  Name: Jeremy Shea DOB: 02/21/1931   Your Medical Team:   Urologist -  Dr. Raynelle Bring, [facility]  Radiation Oncologist - Dr. Arloa Koh, [facility]   Medical Oncologist - Dr. Zola Button, [place]  Recommendations: 1) Androgen Deprivation Therapy ( hormone injection) 2) Repeat CT scan in 2-3 months to reassess cancer   * These recommendations are based on information available as of today's consult.      Recommendations may change depending on the results of further tests or exams.  Next Steps: 1) Scheduled for kidney stone treatment 8/25 in Dr. Lynne Logan office. Will schedule Androgen Deprivation injection  2) Dr. Alinda Money will schedule CT scan   When appointments need to be scheduled, you will be contacted by [facility] and/or [facility].  Questions?  Please do not hesitate to call Cira Rue, RN, BSN, CRNI at [phone] any questions or concerns.  Shirlean Mylar is your Oncology Nurse Navigator and is available to assist you while you're receiving your medical care at [facility].

## 2014-11-16 ENCOUNTER — Encounter (HOSPITAL_COMMUNITY): Payer: Self-pay | Admitting: *Deleted

## 2014-11-16 NOTE — Progress Notes (Addendum)
Call to Dr Gaynelle Arabian to ask if benadryl and valium could be discontinued prior to ESWL on 11/17/14 as patient has severe dementia: Message left with Maudie Mercury RN who works with Dr Gaynelle Arabian.

## 2014-11-17 ENCOUNTER — Encounter (HOSPITAL_COMMUNITY)
Admission: RE | Disposition: A | Discharge status: Home / Self Care | Payer: Self-pay | Source: Ambulatory Visit | Attending: Urology

## 2014-11-17 ENCOUNTER — Ambulatory Visit (HOSPITAL_COMMUNITY): Payer: Medicare HMO

## 2014-11-17 ENCOUNTER — Encounter: Payer: Self-pay | Admitting: General Practice

## 2014-11-17 ENCOUNTER — Encounter (HOSPITAL_COMMUNITY): Payer: Self-pay | Admitting: *Deleted

## 2014-11-17 ENCOUNTER — Ambulatory Visit (HOSPITAL_COMMUNITY)
Admission: RE | Admit: 2014-11-17 | Discharge: 2014-11-17 | Disposition: A | Discharge status: Home / Self Care | Payer: Medicare HMO | Source: Ambulatory Visit | Attending: Urology | Admitting: Urology

## 2014-11-17 DIAGNOSIS — Z8673 Personal history of transient ischemic attack (TIA), and cerebral infarction without residual deficits: Secondary | ICD-10-CM | POA: Diagnosis not present

## 2014-11-17 DIAGNOSIS — N200 Calculus of kidney: Secondary | ICD-10-CM

## 2014-11-17 DIAGNOSIS — Z5309 Procedure and treatment not carried out because of other contraindication: Secondary | ICD-10-CM | POA: Insufficient documentation

## 2014-11-17 DIAGNOSIS — G629 Polyneuropathy, unspecified: Secondary | ICD-10-CM | POA: Insufficient documentation

## 2014-11-17 DIAGNOSIS — Z993 Dependence on wheelchair: Secondary | ICD-10-CM | POA: Insufficient documentation

## 2014-11-17 DIAGNOSIS — N401 Enlarged prostate with lower urinary tract symptoms: Secondary | ICD-10-CM | POA: Diagnosis not present

## 2014-11-17 DIAGNOSIS — N201 Calculus of ureter: Secondary | ICD-10-CM | POA: Diagnosis present

## 2014-11-17 DIAGNOSIS — Z7902 Long term (current) use of antithrombotics/antiplatelets: Secondary | ICD-10-CM | POA: Diagnosis not present

## 2014-11-17 DIAGNOSIS — I10 Essential (primary) hypertension: Secondary | ICD-10-CM | POA: Diagnosis not present

## 2014-11-17 DIAGNOSIS — R32 Unspecified urinary incontinence: Secondary | ICD-10-CM | POA: Diagnosis not present

## 2014-11-17 DIAGNOSIS — R3912 Poor urinary stream: Secondary | ICD-10-CM | POA: Diagnosis not present

## 2014-11-17 DIAGNOSIS — K219 Gastro-esophageal reflux disease without esophagitis: Secondary | ICD-10-CM | POA: Insufficient documentation

## 2014-11-17 DIAGNOSIS — F0390 Unspecified dementia without behavioral disturbance: Secondary | ICD-10-CM | POA: Insufficient documentation

## 2014-11-17 DIAGNOSIS — R3916 Straining to void: Secondary | ICD-10-CM | POA: Diagnosis not present

## 2014-11-17 DIAGNOSIS — M1990 Unspecified osteoarthritis, unspecified site: Secondary | ICD-10-CM | POA: Insufficient documentation

## 2014-11-17 DIAGNOSIS — Z87891 Personal history of nicotine dependence: Secondary | ICD-10-CM | POA: Insufficient documentation

## 2014-11-17 DIAGNOSIS — R3915 Urgency of urination: Secondary | ICD-10-CM | POA: Diagnosis not present

## 2014-11-17 DIAGNOSIS — R350 Frequency of micturition: Secondary | ICD-10-CM | POA: Insufficient documentation

## 2014-11-17 DIAGNOSIS — Z7951 Long term (current) use of inhaled steroids: Secondary | ICD-10-CM | POA: Insufficient documentation

## 2014-11-17 DIAGNOSIS — Z79899 Other long term (current) drug therapy: Secondary | ICD-10-CM | POA: Diagnosis not present

## 2014-11-17 DIAGNOSIS — C61 Malignant neoplasm of prostate: Secondary | ICD-10-CM | POA: Insufficient documentation

## 2014-11-17 SURGERY — LITHOTRIPSY, ESWL
Anesthesia: LOCAL | Laterality: Right

## 2014-11-17 MED ORDER — DIAZEPAM 5 MG PO TABS: 10.0000 mg | ORAL_TABLET | ORAL | Status: DC

## 2014-11-17 MED ORDER — CIPROFLOXACIN HCL 500 MG PO TABS
500.0000 mg | ORAL_TABLET | ORAL | Status: AC
  Administered 2014-11-17: 500 mg via ORAL
  Filled 2014-11-17: qty 1

## 2014-11-17 MED ORDER — SODIUM CHLORIDE 0.9 % IV SOLN
INTRAVENOUS | Status: DC
  Administered 2014-11-17: 09:00:00 via INTRAVENOUS

## 2014-11-17 MED ORDER — DIPHENHYDRAMINE HCL 25 MG PO CAPS: 25.0000 mg | ORAL_CAPSULE | ORAL | Status: DC

## 2014-11-17 NOTE — Progress Notes (Signed)
Urology Progress Note  Day of Surgery Subjective: Pt took his Plavix  Saturday night, so he cancelled for lithotripsy today, because he took Plavix within 5 days of his procedure. Pt is not toxic, and not having any more pain. Discussed with Aris Everts; and discussed with Dr. Alinda Money, and with patient, and will try to put on schedule for Monday. Will discuss with  Pt and wife.    No acute urologic events overnight Ambulation:  Wheelchair    Pain: No change  Objective:  Blood pressure 131/67, pulse 47, temperature 98 F (36.7 C), temperature source Oral, resp. rate 18, height 5\' 10"  (1.778 m), weight 85.276 kg (188 lb), SpO2 98 %.  Physical Exam: wheelchairbound.  Pt is demented.  General:  No acute distress, awake  Genitourinary:   A: Discussed with wife: She went ahead and gave Jeremy Shea his Plavix Saturday night, and is worried about keeping him off of Plavix until Monday for Lithotripsy. She understands that there is risk in waiting for lithotripsy, and also risk in anesthesia for stenting or ureteroscopy-as explained by Dr. Alinda Money. Also, she understands that he will need to be off his Plavix after his lithotripsy-as discussed with Dr. Alinda Money also-for a few days, in order to let the tissues heal, so they will not bleed when he re-starts his Plavix again.        No results for input(s): HGB, WBC, PLT in the last 72 hours.  No results for input(s): NA, K, CL, CO2, BUN, CREATININE, CALCIUM, GFRNONAA, GFRAA in the last 72 hours.  Invalid input(s): MAGNESIUM   No results for input(s): INR, APTT in the last 72 hours.  Invalid input(s): PT   Invalid input(s): ABG  Assessment/Plan:  Re-schedule Lithotripsy for Monday.  Stay off Plavix.

## 2014-11-17 NOTE — Progress Notes (Signed)
Patient's wife concerned with patient's dementia about the typical litho pre meds of valium/benadryl. Takes patient longer to wake up from sedation than normal per wife. Spoke to [employer] on [employer] truck and she stated NOT to give valium/benadryl PO prior to procedure.

## 2014-11-17 NOTE — Progress Notes (Signed)
[facility] Psychosocial Distress Screening Spiritual Care  Met with Mr Lottman, his wife, and their two daughters in [facility] to introduce Spiritual Care and [place] team/resources, reviewing distress screen per protocol.  The patient scored a 5 on the Psychosocial Distress Thermometer which indicates moderate distress. Also assessed for distress and other psychosocial needs.   ONCBCN DISTRESS SCREENING 11/17/2014  Distress experienced in past week (1-10) 5  Emotional problem type Depression;Nervousness/Anxiety;Adjusting to illness;Feeling hopeless  Spiritual/Religous concerns type Loss of sense of purpose  Information Concerns Type Lack of info about treatment  Physical Problem type Pain;Sleep/insomnia;Getting around;Bathing/dressing;Sexual problems  Referral to support programs Yes  Other Spiritual Care, counseling intern   Mr Smethurst was friendly and upbeat, staying at a humor level during this encounter, while his family identified more needs and vulnerability.  He notes that prayer is a source of coping and meaning-making.  Daughter Levada Dy, in particular, was tearful about facing her dad's mortality; she welcomed pastoral presence and connected well with the availability of further support from chaplain or counseling intern.  Provided reflective, empathic listening; normalization of feelings; and encouragement to utilize support resources for processing feelings and anticipatory grief.  Verbalized and reaffirmed the challenges of the "sandwich generation," as Levada Dy is balancing work, concern for her parents, and caring for her children (77, 110, and 5yo twins).   Follow up needed: No. Family aware of ongoing chaplain and counseling intern availability.  Levada Dy took my card and plans to reach out for further support.  Please also page as needs arise.  Thank you.  [place], [place] Pager [phone] Voicemail  [phone]

## 2014-11-17 NOTE — H&P (Signed)
History of Present Illness Jeremy Shea is 81 years old with the following urologic history:    1) Prostate cancer: He presented in July 2016 with worsening LUTS, right flank pain, an elevated PSA, and a worrisome DRE with concern for right sided palpable malignancy with extraprostatic extension. His PSA was noted to be persistently elevated at 40.4. He underwent a prostate needle biopsy on 10/19/14 that confirmed Gleason 5+5=10 adenocarcinoma of the prostate with 8 out of 12 biopsy cores (including all 6 right sided biopsies) positive for malignancy. He has a history of prostatitis. He has strong family history of prostate cancer with 2 brothers having had prostate cancer.    2) BPH/LUTS: He has developed worsening LUTS including urinary frequency, intermittency, urgency, weak stream, straining, and nocturia. He also developed incontinence which has been unconscious without clear stress or urge incontinence. Baseline IPSS was 27. Baseline PVR was 198 cc.    ** His past medical history is significant for a stroke in 2000 (chronically takes Plavix), neuropathy of unclear etiology (followed by [facility] which causes weakness and numbness of his lower extremities and resulting difficulty with ambulation.     Interval history:    He follows up today after having undergone his prostate biopsy which did indeed demonstrate prostate cancer. In fact, he was noted to have a 12 biopsies positive and did have very high-grade arias of Gleason 9 and one area of Gleason 5+5 = 10 adenocarcinoma. This was consistent with a very high grade and locally advanced disease. He also proceeded with staging evaluation including a bone scan and CT scan of the abdomen and pelvis. These studies were reviewed and his bone scan fortunately did not demonstrate any evidence of clear metastases. His CT scan did demonstrate one concerning iliac lymph node without other findings to suggest metastatic disease. He also was  noted to have right hydronephrosis with a 1 cm right UPJ calculus. He states that his flank pain has been relatively well controlled recently. He also informs me today that he has been undergoing further evaluation for his neuropathy and saw Jeremy Shea last week. He is scheduled to have an MRI of his spine today. He continues to have stable incontinence. His PVRs last visit was 188 cc. On review of his CT scan, the Hounsfield unit measurement on his right ureteral stone was approximately 1000. 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   Past Medical History Problems  1. History of Anxiety and depression (F41.9,F32.9) 2. History of arthritis (Z87.39) 3. History of heartburn (Z87.898) 4. History of hypertension (Z86.79) 5. History of peripheral neuropathy (Z86.69) 6. History of stomach ulcers (Z87.19) 7. History of stroke (H84.69)  Surgical History Problems  1. History of Appendectomy 2. History of Knee Surgery 3. History of Shoulder Surgery Left  Current Meds 1. Avapro 300 MG Oral Tablet;  Therapy: (Recorded:19Jul2016) to Recorded 2. Claritin 10 MG Oral Tablet;  Therapy: (Recorded:19Jul2016) to Recorded 3. Exelon 9.5 MG/24HR Transdermal Patch 24 Hour;  Therapy: (Recorded:19Jul2016) to Recorded 4. Fluticasone Propionate 50 MCG/ACT Nasal Suspension;  Therapy: (Recorded:19Jul2016) to Recorded 5. Gabapentin 100 MG TABS;  Therapy: (Recorded:19Jul2016) to Recorded 6. Metoprolol Succinate ER 25 MG Oral Tablet Extended Release 24 Hour;  Therapy: (Recorded:19Jul2016) to Recorded 7. Multi Vitamin/Minerals Oral Tablet;  Therapy: (Recorded:19Jul2016) to Recorded 8. Pantoprazole Sodium 40 MG Oral Tablet Delayed Release;  Therapy: (Recorded:19Jul2016) to Recorded 9. Plavix 75 MG Oral Tablet;  Therapy: (Recorded:19Jul2016) to Recorded  Allergies Medication  1. Aspirin Low Dose TABS 2.  Codeine Derivatives 3. Sulfa Drugs  Family History Problems  1. Family history of Death of family member  : Mother, Father   Mother at age 79; heart failureFather at age 85; heart failure 2. Family history of heart failure (Z82.49) : Mother, Father 3. Family history of prostate cancer (Z80.42) : Brother, Merchant navy officer  Social History Problems  1. Denied: History of Alcohol use 2. Former smoker [phone])   1 ppd for 30 years and quit 37 years ago 3. Married 4. Number of children   2 daughters 5. Retired  Review of Systems  Genitourinary: no hematuria.  Cardiovascular: no chest pain and no leg swelling.    Physical Exam Constitutional: Well nourished and well developed . No acute distress.    Results/Data I have reviewed his pathology report, bone scan, and CT scan independently. Findings are as outlined above.     Assessment Assessed  1. Prostate cancer (C61) 2. Benign prostatic hyperplasia (BPH) with straining on urination (N40.1,R39.16)  Plan Prostate cancer  1. Follow-up Office  Follow-up  Status: Hold For - Appointment,Date of Service  Requested  for: 16Aug2016  Discussion/Summary 1. Locally advanced, high-grade prostate cancer: I had a detailed discussion with Jeremy Shea and his family today. It was explained that he has an extremely aggressive prostate cancer and has locally advanced disease based on his rectal exam. However, he does not have clear evidence of metastatic disease although does have one concerning right pelvic lymph node. Although his progressive lower extremity symptoms are concerning, it would be unlikely related to metastatic prostate cancer based on his negative bone scan and no clear abnormalities noted on his CT scan. However, I agree with proceeding with his MRI today to further elucidate the cause of his lower extremity symptoms and to absolutely rule out metastatic cancer as a possibility.   The patient was counseled about the natural history of prostate cancer and the standard treatment options that are available for prostate cancer. It was  explained to him how his age and life expectancy, clinical stage, Gleason score, and PSA affect his prognosis, the decision to proceed with additional staging studies, as well as how that information influences recommended treatment strategies. We discussed the roles for active surveillance, radiation therapy, surgical therapy, androgen deprivation, as well as ablative therapy options for the treatment of prostate cancer as appropriate to his individual cancer situation. We discussed the risks and benefits of these options with regard to their impact on cancer control and also in terms of potential adverse events, complications, and impact on quiality of life particularly related to urinary, bowel, and sexual function. The patient was encouraged to ask questions throughout the discussion today and all questions were answered to his stated satisfaction. In addition, the patient was provided with and/or directed to appropriate resources and literature for further education about prostate cancer and treatment options.     Considering that he appears to have high risk and locally advanced prostate cancer, he is potentially a candidate for curative treatment. Although one option would be to biopsy his right external iliac lymph node, I'm unsure whether this would likely change management and prevent him from being a candidate for potentially curative treatment. I did discuss androgen deprivation therapy as a treatment that we can begin in the near future and we discussed the potential risks and side effects of this therapy. I would like him to follow up in the prostate cancer multidisciplinary clinic next week to discuss the option of radiotherapy as well in  conjunction with androgen deprivation therapy as a potential option for treatment. This will be scheduled. Assuming that there is agreement, he was then begin androgen deprivation therapy, require fiducial marker placement, and is scheduled to begin radiation  therapy. Although he does live in [place], he is very interested in proceeding with treatment in [place]. All questions were answered for him and his family regarding his cancer situation and the reasons for these recommendations.    2. Right UPJ calculus: The cause of his right flank pain appears to be a right proximal ureteral stone. The stone appears to be calcium oxalate based on its Hounsfield unit measurements and based on its size, density, and location, it is an appropriate option to consider treatment with shock wave lithotripsy or ureteroscopic laser lithotripsy. He understands that it is unlikely to pass spontaneously. We reviewed the pros and cons of shockwave lithotripsy and ureteroscopic laser lithotripsy. Although shockwave lithotripsy may have a lower success rate and would require him to stop his Plavix, he would be able to avoid a general anesthetic and more invasive treatment. He understands that the advantage of ureteroscopic laser lithotripsy would be more definitive success rate albeit with a more invasive procedure and the risk of general anesthesia. However, he would be able to continue his antiplatelet therapy with ureteroscopic treatment. After discussion with the family, they would definitely like to avoid general anesthesia possible. He was able to stop his Plavix around the time of his prostate biopsy without incident and does not have a cardiac stent or other mandatory reason to continue antiplatelet therapy. He takes this mainly for stroke risk reduction. His stone does appear radiopaque on his scout film on his CT scan. He will tentatively be scheduled for ESWL next Thursday. We have reviewed the potential risks, complications, and alternative options in detail. We have discussed the expected recovery process. He will stop his Plavix 5 days prior to his procedure.    3. Incontinence: He does not appear to have overflow incontinence based on his prior PVR values. We will  discuss this further at his visit next week and we'll consider further addressing his lower urinary tract symptoms pending treatment for his prostate cancer and kidney stone.    Cc: Dr. [name] - [place], [facility]  Dr. Floyde Parkins  A total of 45 minutes were spent in the overall care of the patient today with 45 minutes in direct face to face consultation.    Signatures Electronically signed by : Raynelle Bring, M.D.; Nov 08 2014  1:12PM EST

## 2014-11-18 ENCOUNTER — Other Ambulatory Visit: Payer: Self-pay | Admitting: Urology

## 2014-11-18 ENCOUNTER — Encounter (HOSPITAL_COMMUNITY): Payer: Self-pay | Admitting: *Deleted

## 2014-11-18 NOTE — H&P (Signed)
History of Present Illness [name] is 81 years old with the following urologic history:    1) Prostate cancer: He presented in July 2016 with worsening LUTS, right flank pain, an elevated PSA, and a worrisome DRE with concern for right sided palpable malignancy with extraprostatic extension. His PSA was noted to be persistently elevated at 40.4. He underwent a prostate needle biopsy on 10/19/14 that confirmed Gleason 5+5=10 adenocarcinoma of the prostate with 8 out of 12 biopsy cores (including all 6 right sided biopsies) positive for malignancy. He has a history of prostatitis. He has strong family history of prostate cancer with 2 brothers having had prostate cancer.    2) BPH/LUTS: He has developed worsening LUTS including urinary frequency, intermittency, urgency, weak stream, straining, and nocturia. He also developed incontinence which has been unconscious without clear stress or urge incontinence. Baseline IPSS was 27. Baseline PVR was 198 cc.    ** His past medical history is significant for a stroke in 2000 (chronically takes Plavix), neuropathy of unclear etiology (followed by [facility] which causes weakness and numbness of his lower extremities and resulting difficulty with ambulation.     Interval history:    He follows up today after having undergone his prostate biopsy which did indeed demonstrate prostate cancer. In fact, he was noted to have a 12 biopsies positive and did have very high-grade arias of Gleason 9 and one area of Gleason 5+5 = 10 adenocarcinoma. This was consistent with a very high grade and locally advanced disease. He also proceeded with staging evaluation including a bone scan and CT scan of the abdomen and pelvis. These studies were reviewed and his bone scan fortunately did not demonstrate any evidence of clear metastases. His CT scan did demonstrate one concerning iliac lymph node without other findings to suggest metastatic disease. He also was  noted to have right hydronephrosis with a 1 cm right UPJ calculus. He states that his flank pain has been relatively well controlled recently. He also informs me today that he has been undergoing further evaluation for his neuropathy and saw Dr. Jannifer Shea last week. He is scheduled to have an MRI of his spine today. He continues to have stable incontinence. His PVRs last visit was 188 cc. On review of his CT scan, the Hounsfield unit measurement on his right ureteral stone was approximately 1000. 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   Past Medical History Problems  1. History of Anxiety and depression (F41.9,F32.9) 2. History of arthritis (Z87.39) 3. History of heartburn (Z87.898) 4. History of hypertension (Z86.79) 5. History of peripheral neuropathy (Z86.69) 6. History of stomach ulcers (Z87.19) 7. History of stroke (Z61.09)  Surgical History Problems  1. History of Appendectomy 2. History of Knee Surgery 3. History of Shoulder Surgery Left  Current Meds 1. Avapro 300 MG Oral Tablet;  Therapy: (Recorded:19Jul2016) to Recorded 2. Claritin 10 MG Oral Tablet;  Therapy: (Recorded:19Jul2016) to Recorded 3. Exelon 9.5 MG/24HR Transdermal Patch 24 Hour;  Therapy: (Recorded:19Jul2016) to Recorded 4. Fluticasone Propionate 50 MCG/ACT Nasal Suspension;  Therapy: (Recorded:19Jul2016) to Recorded 5. Gabapentin 100 MG TABS;  Therapy: (Recorded:19Jul2016) to Recorded 6. Metoprolol Succinate ER 25 MG Oral Tablet Extended Release 24 Hour;  Therapy: (Recorded:19Jul2016) to Recorded 7. Multi Vitamin/Minerals Oral Tablet;  Therapy: (Recorded:19Jul2016) to Recorded 8. Pantoprazole Sodium 40 MG Oral Tablet Delayed Release;  Therapy: (Recorded:19Jul2016) to Recorded 9. Plavix 75 MG Oral Tablet;  Therapy: (Recorded:19Jul2016) to Recorded  Allergies Medication  1. Aspirin Low Dose TABS 2.  Codeine Derivatives 3. Sulfa Drugs  Family History Problems  1. Family history of Death of family member  : Mother, Father   Mother at age 52; heart failureFather at age 70; heart failure 2. Family history of heart failure (Z82.49) : Mother, Father 3. Family history of prostate cancer (Z80.42) : Brother, Jeremy Shea  Social History Problems  1. Denied: History of Alcohol use 2. Former smoker [phone])   1 ppd for 30 years and quit 37 years ago 3. Married 4. Number of children   2 daughters 5. Retired  Review of Systems  Genitourinary: no hematuria.  Cardiovascular: no chest pain and no leg swelling.    Physical Exam Constitutional: Well nourished and well developed . No acute distress.    Results/Data I have reviewed his pathology report, bone scan, and CT scan independently. Findings are as outlined above.     Assessment Assessed  1. Prostate cancer (C61) 2. Benign prostatic hyperplasia (BPH) with straining on urination (N40.1,R39.16)  Plan Prostate cancer  1. Follow-up Office  Follow-up  Status: Hold For - Appointment,Date of Service  Requested  for: 16Aug2016  Discussion/Summary 1. Locally advanced, high-grade prostate cancer: I had a detailed discussion with Jeremy Shea and his family today. It was explained that he has an extremely aggressive prostate cancer and has locally advanced disease based on his rectal exam. However, he does not have clear evidence of metastatic disease although does have one concerning right pelvic lymph node. Although his progressive lower extremity symptoms are concerning, it would be unlikely related to metastatic prostate cancer based on his negative bone scan and no clear abnormalities noted on his CT scan. However, I agree with proceeding with his MRI today to further elucidate the cause of his lower extremity symptoms and to absolutely rule out metastatic cancer as a possibility.   The patient was counseled about the natural history of prostate cancer and the standard treatment options that are available for prostate cancer. It was  explained to him how his age and life expectancy, clinical stage, Gleason score, and PSA affect his prognosis, the decision to proceed with additional staging studies, as well as how that information influences recommended treatment strategies. We discussed the roles for active surveillance, radiation therapy, surgical therapy, androgen deprivation, as well as ablative therapy options for the treatment of prostate cancer as appropriate to his individual cancer situation. We discussed the risks and benefits of these options with regard to their impact on cancer control and also in terms of potential adverse events, complications, and impact on quiality of life particularly related to urinary, bowel, and sexual function. The patient was encouraged to ask questions throughout the discussion today and all questions were answered to his stated satisfaction. In addition, the patient was provided with and/or directed to appropriate resources and literature for further education about prostate cancer and treatment options.     Considering that he appears to have high risk and locally advanced prostate cancer, he is potentially a candidate for curative treatment. Although one option would be to biopsy his right external iliac lymph node, I'm unsure whether this would likely change management and prevent him from being a candidate for potentially curative treatment. I did discuss androgen deprivation therapy as a treatment that we can begin in the near future and we discussed the potential risks and side effects of this therapy. I would like him to follow up in the prostate cancer multidisciplinary clinic next week to discuss the option of radiotherapy as well in  conjunction with androgen deprivation therapy as a potential option for treatment. This will be scheduled. Assuming that there is agreement, he was then begin androgen deprivation therapy, require fiducial marker placement, and is scheduled to begin radiation  therapy. Although he does live in [place], he is very interested in proceeding with treatment in [place]. All questions were answered for him and his family regarding his cancer situation and the reasons for these recommendations.    2. Right UPJ calculus: The cause of his right flank pain appears to be a right proximal ureteral stone. The stone appears to be calcium oxalate based on its Hounsfield unit measurements and based on its size, density, and location, it is an appropriate option to consider treatment with shock wave lithotripsy or ureteroscopic laser lithotripsy. He understands that it is unlikely to pass spontaneously. We reviewed the pros and cons of shockwave lithotripsy and ureteroscopic laser lithotripsy. Although shockwave lithotripsy may have a lower success rate and would require him to stop his Plavix, he would be able to avoid a general anesthetic and more invasive treatment. He understands that the advantage of ureteroscopic laser lithotripsy would be more definitive success rate albeit with a more invasive procedure and the risk of general anesthesia. However, he would be able to continue his antiplatelet therapy with ureteroscopic treatment. After discussion with the family, they would definitely like to avoid general anesthesia possible. He was able to stop his Plavix around the time of his prostate biopsy without incident and does not have a cardiac stent or other mandatory reason to continue antiplatelet therapy. He takes this mainly for stroke risk reduction. His stone does appear radiopaque on his scout film on his CT scan. He will tentatively be scheduled for ESWL next Thursday. We have reviewed the potential risks, complications, and alternative options in detail. We have discussed the expected recovery process. He will stop his Plavix 5 days prior to his procedure.    3. Incontinence: He does not appear to have overflow incontinence based on his prior PVR values. We will  discuss this further at his visit next week and we'll consider further addressing his lower urinary tract symptoms pending treatment for his prostate cancer and kidney stone.    Cc: Dr. [name] - [place], [facility]  Dr. Floyde Parkins  A total of 45 minutes were spent in the overall care of the patient today with 45 minutes in direct face to face consultation.    Signatures Electronically signed by : Raynelle Bring, M.D.; Nov 08 2014  1:12PM EST

## 2014-11-21 ENCOUNTER — Encounter (HOSPITAL_COMMUNITY): Payer: Self-pay | Admitting: *Deleted

## 2014-11-21 ENCOUNTER — Ambulatory Visit (HOSPITAL_COMMUNITY)
Admission: RE | Admit: 2014-11-21 | Discharge: 2014-11-21 | Disposition: A | Discharge status: Home / Self Care | Payer: Medicare HMO | Source: Ambulatory Visit | Attending: Urology | Admitting: Urology

## 2014-11-21 ENCOUNTER — Encounter (HOSPITAL_COMMUNITY)
Admission: RE | Disposition: A | Discharge status: Home / Self Care | Payer: Self-pay | Source: Ambulatory Visit | Attending: Urology

## 2014-11-21 ENCOUNTER — Ambulatory Visit (HOSPITAL_COMMUNITY): Payer: Medicare HMO

## 2014-11-21 DIAGNOSIS — N401 Enlarged prostate with lower urinary tract symptoms: Secondary | ICD-10-CM | POA: Diagnosis not present

## 2014-11-21 DIAGNOSIS — C61 Malignant neoplasm of prostate: Secondary | ICD-10-CM | POA: Insufficient documentation

## 2014-11-21 DIAGNOSIS — Z7902 Long term (current) use of antithrombotics/antiplatelets: Secondary | ICD-10-CM | POA: Diagnosis not present

## 2014-11-21 DIAGNOSIS — Z8042 Family history of malignant neoplasm of prostate: Secondary | ICD-10-CM | POA: Diagnosis not present

## 2014-11-21 DIAGNOSIS — R3916 Straining to void: Secondary | ICD-10-CM | POA: Insufficient documentation

## 2014-11-21 DIAGNOSIS — I10 Essential (primary) hypertension: Secondary | ICD-10-CM | POA: Diagnosis not present

## 2014-11-21 DIAGNOSIS — Z7951 Long term (current) use of inhaled steroids: Secondary | ICD-10-CM | POA: Insufficient documentation

## 2014-11-21 DIAGNOSIS — Z9889 Other specified postprocedural states: Secondary | ICD-10-CM | POA: Insufficient documentation

## 2014-11-21 DIAGNOSIS — N201 Calculus of ureter: Secondary | ICD-10-CM | POA: Diagnosis present

## 2014-11-21 DIAGNOSIS — Z885 Allergy status to narcotic agent status: Secondary | ICD-10-CM | POA: Insufficient documentation

## 2014-11-21 DIAGNOSIS — F329 Major depressive disorder, single episode, unspecified: Secondary | ICD-10-CM | POA: Diagnosis not present

## 2014-11-21 DIAGNOSIS — Z8673 Personal history of transient ischemic attack (TIA), and cerebral infarction without residual deficits: Secondary | ICD-10-CM | POA: Insufficient documentation

## 2014-11-21 DIAGNOSIS — Z882 Allergy status to sulfonamides status: Secondary | ICD-10-CM | POA: Insufficient documentation

## 2014-11-21 DIAGNOSIS — Z886 Allergy status to analgesic agent status: Secondary | ICD-10-CM | POA: Insufficient documentation

## 2014-11-21 DIAGNOSIS — M1990 Unspecified osteoarthritis, unspecified site: Secondary | ICD-10-CM | POA: Insufficient documentation

## 2014-11-21 DIAGNOSIS — Z79899 Other long term (current) drug therapy: Secondary | ICD-10-CM | POA: Insufficient documentation

## 2014-11-21 DIAGNOSIS — R109 Unspecified abdominal pain: Secondary | ICD-10-CM | POA: Insufficient documentation

## 2014-11-21 DIAGNOSIS — G629 Polyneuropathy, unspecified: Secondary | ICD-10-CM | POA: Diagnosis not present

## 2014-11-21 DIAGNOSIS — Z8249 Family history of ischemic heart disease and other diseases of the circulatory system: Secondary | ICD-10-CM | POA: Diagnosis not present

## 2014-11-21 DIAGNOSIS — Z8711 Personal history of peptic ulcer disease: Secondary | ICD-10-CM | POA: Diagnosis not present

## 2014-11-21 DIAGNOSIS — Z9049 Acquired absence of other specified parts of digestive tract: Secondary | ICD-10-CM | POA: Diagnosis not present

## 2014-11-21 DIAGNOSIS — N200 Calculus of kidney: Secondary | ICD-10-CM

## 2014-11-21 DIAGNOSIS — Z87891 Personal history of nicotine dependence: Secondary | ICD-10-CM | POA: Diagnosis not present

## 2014-11-21 HISTORY — DX: Malignant neoplasm of prostate: C61

## 2014-11-21 SURGERY — LITHOTRIPSY, ESWL
Anesthesia: LOCAL | Laterality: Right

## 2014-11-21 MED ORDER — CIPROFLOXACIN HCL 500 MG PO TABS
500.0000 mg | ORAL_TABLET | ORAL | Status: AC
  Administered 2014-11-21: 500 mg via ORAL
  Filled 2014-11-21: qty 1

## 2014-11-21 MED ORDER — DIPHENHYDRAMINE HCL 25 MG PO CAPS: 25.0000 mg | ORAL_CAPSULE | ORAL | Status: DC

## 2014-11-21 MED ORDER — DIAZEPAM 5 MG PO TABS: 10.0000 mg | ORAL_TABLET | ORAL | Status: DC

## 2014-11-21 MED ORDER — SODIUM CHLORIDE 0.9 % IV SOLN
INTRAVENOUS | Status: DC
  Administered 2014-11-21: 16:00:00 via INTRAVENOUS

## 2014-11-21 NOTE — Progress Notes (Signed)
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

## 2014-11-21 NOTE — Op Note (Signed)
[facility] operative note in scanned chart.

## 2014-11-21 NOTE — Discharge Instructions (Signed)
1. You should strain your urine and collect all fragments and bring them to your follow up appointment.  2. You should take your pain medication as needed.  Please call if your pain is severe to the point that it is not controlled with your pain medication. 3. You should call if you develop fever > 101 or persistent nausea or vomiting. 4. Your doctor may prescribe tamsulosin to take to help facilitate stone passage. 5.  You may resume Plavix in 3-4 days if no blood in the urine and pain is minimal.  Otherwise, please call your doctor to discuss whether you should restart this medication.

## 2014-11-21 NOTE — Interval H&P Note (Signed)
History and Physical Interval Note:  11/21/2014 4:26 PM  Jeremy Shea  has presented today for surgery, with the diagnosis of RIGHT URETEROPELVIC JUNCTION CALCULUS  The various methods of treatment have been discussed with the patient and family. After consideration of risks, benefits and other options for treatment, the patient has consented to  Procedure(s): RIGHT EXTRACORPOREAL SHOCK WAVE LITHOTRIPSY ([facility]) (Right) as a surgical intervention .  The patient's history has been reviewed, patient examined, no change in status, stable for surgery.  I have reviewed the patient's chart and labs.  Questions were answered to the patient's satisfaction.     [name],LES

## 2015-01-02 ENCOUNTER — Telehealth: Payer: Self-pay | Admitting: Neurology

## 2015-01-02 ENCOUNTER — Ambulatory Visit
Admission: RE | Admit: 2015-01-02 | Discharge: 2015-01-02 | Disposition: A | Discharge status: Home / Self Care | Payer: Medicare HMO | Source: Ambulatory Visit | Attending: Neurology | Admitting: Neurology

## 2015-01-02 DIAGNOSIS — R269 Unspecified abnormalities of gait and mobility: Secondary | ICD-10-CM | POA: Diagnosis not present

## 2015-01-02 NOTE — Telephone Encounter (Signed)
I called the wife. MRI of the low back does not show anything that should be significantly impacting walking. The patient does not have any significant spinal stenosis. MRI of the thoracic spine show some cord compression the thoracic level T8-9, with some cord signal change area this may be the etiology of his walking issue. The patient will follow-up in office this week. I'm not sure the patient is a surgical candidate.   MRI lumbar spine 01/02/2015:  IMPRESSION: This is an abnormal MRI of the lumbar spine showing multilevel degenerative changes as detailed above. The most significant findings are: 1. At L5-S1 there is disc herniation, facet hypertrophy and ligamentum flavum hypertrophy causing moderate spinal stenosis, moderate biforaminal narrowing and moderately severe lateral recess stenosis bilaterally. This could lead to compression of either S1 nerve root and there is also some encroachment upon both L5 nerve roots. 2. Degenerative changes at other levels is less likely to lead to nerve root impingement.

## 2015-01-04 ENCOUNTER — Ambulatory Visit (INDEPENDENT_AMBULATORY_CARE_PROVIDER_SITE_OTHER): Payer: Medicare HMO | Admitting: Neurology

## 2015-01-04 ENCOUNTER — Encounter: Payer: Self-pay | Admitting: Neurology

## 2015-01-04 VITALS — BP 116/57 | HR 55 | Ht 70.0 in

## 2015-01-04 DIAGNOSIS — G63 Polyneuropathy in diseases classified elsewhere: Secondary | ICD-10-CM

## 2015-01-04 DIAGNOSIS — R269 Unspecified abnormalities of gait and mobility: Secondary | ICD-10-CM

## 2015-01-04 DIAGNOSIS — R413 Other amnesia: Secondary | ICD-10-CM | POA: Diagnosis not present

## 2015-01-04 NOTE — Patient Instructions (Signed)
Fall Prevention in the Home  Falls can cause injuries and can affect people from all age groups. There are many simple things that you can do to make your home safe and to help prevent falls. WHAT CAN I DO ON THE OUTSIDE OF MY HOME?  Regularly repair the edges of walkways and driveways and fix any cracks.  Remove high doorway thresholds.  Trim any shrubbery on the main path into your home.  Use bright outdoor lighting.  Clear walkways of debris and clutter, including tools and rocks.  Regularly check that handrails are securely fastened and in good repair. Both sides of any steps should have handrails.  Install guardrails along the edges of any raised decks or porches.  Have leaves, snow, and ice cleared regularly.  Use sand or salt on walkways during winter months.  In the garage, clean up any spills right away, including grease or oil spills. WHAT CAN I DO IN THE BATHROOM?  Use night lights.  Install grab bars by the toilet and in the tub and shower. Do not use towel bars as grab bars.  Use non-skid mats or decals on the floor of the tub or shower.  If you need to sit down while you are in the shower, use a plastic, non-slip stool..  Keep the floor dry. Immediately clean up any water that spills on the floor.  Remove soap buildup in the tub or shower on a regular basis.  Attach bath mats securely with double-sided non-slip rug tape.  Remove throw rugs and other tripping hazards from the floor. WHAT CAN I DO IN THE BEDROOM?  Use night lights.  Make sure that a bedside light is easy to reach.  Do not use oversized bedding that drapes onto the floor.  Have a firm chair that has side arms to use for getting dressed.  Remove throw rugs and other tripping hazards from the floor. WHAT CAN I DO IN THE KITCHEN?   Clean up any spills right away.  Avoid walking on wet floors.  Place frequently used items in easy-to-reach places.  If you need to reach for something  above you, use a sturdy step stool that has a grab bar.  Keep electrical cables out of the way.  Do not use floor polish or wax that makes floors slippery. If you have to use wax, make sure that it is non-skid floor wax.  Remove throw rugs and other tripping hazards from the floor. WHAT CAN I DO IN THE STAIRWAYS?  Do not leave any items on the stairs.  Make sure that there are handrails on both sides of the stairs. Fix handrails that are broken or loose. Make sure that handrails are as long as the stairways.  Check any carpeting to make sure that it is firmly attached to the stairs. Fix any carpet that is loose or worn.  Avoid having throw rugs at the top or bottom of stairways, or secure the rugs with carpet tape to prevent them from moving.  Make sure that you have a light switch at the top of the stairs and the bottom of the stairs. If you do not have them, have them installed. WHAT ARE SOME OTHER FALL PREVENTION TIPS?  Wear closed-toe shoes that fit well and support your feet. Wear shoes that have rubber soles or low heels.  When you use a stepladder, make sure that it is completely opened and that the sides are firmly locked. Have someone hold the ladder while you   are using it. Do not climb a closed stepladder.  Add color or contrast paint or tape to grab bars and handrails in your home. Place contrasting color strips on the first and last steps.  Use mobility aids as needed, such as canes, walkers, scooters, and crutches.  Turn on lights if it is dark. Replace any light bulbs that burn out.  Set up furniture so that there are clear paths. Keep the furniture in the same spot.  Fix any uneven floor surfaces.  Choose a carpet design that does not hide the edge of steps of a stairway.  Be aware of any and all pets.  Review your medicines with your healthcare provider. Some medicines can cause dizziness or changes in blood pressure, which increase your risk of falling. Talk  with your health care provider about other ways that you can decrease your risk of falls. This may include working with a physical therapist or trainer to improve your strength, balance, and endurance.   This information is not intended to replace advice given to you by your health care provider. Make sure you discuss any questions you have with your health care provider.   Document Released: 03/01/2002 Document Revised: 07/26/2014 Document Reviewed: 04/15/2014 [employer] Interactive Patient Education 2016 [employer]  

## 2015-01-04 NOTE — Progress Notes (Signed)
Reason for visit: Mobility assessment  Jeremy Shea is an 81 y.o. male  History of present illness:  Jeremy Shea is an 81 year old right-handed white male with a history of a peripheral neuropathy, and a gait disorder. The patient has had a significant decline in his ability to ambulate, and a workup revealed evidence of a thoracic myelopathy at the thoracic level 8-9. The patient has reported some numbness up to the buttocks area, he has had some change in bowel or bladder control, he will wear adult diapers at nighttime, he has urinary frequency during the day. He reports no falls since last seen. He is unable to ambulate independently at this point. He can stand only with assistance. The patient cannot mobilize independently without a scooter. The patient comes into the office today for mobility assessment. The patient denies any significant discomfort in the back or legs. He is on some low-dose gabapentin for facial pain that has been helpful taking 300 mg twice daily. The patient returns to this office for further evaluation and management. Currently, the patient is not safe using a cane, walker, or crutches. The ability to use a manual wheelchair is impaired by the patient's fatigue issues with the upper extremities. The scooter can be used inside the home, and will increase the independence of the patient, and assist him in performing activities of daily living independently such as feeding himself, getting to the bathroom and back. The patient can safely transfer to and from the scooter.  Past Medical History  Diagnosis Date  . Hypertension   . Stroke ([place]) 2000  . Prostate disorder   . Dementia   . Neuropathic pain of foot     numbness and weakness of lower extremities  . Complication of anesthesia     hard time waking up  . Memory disorder 12/22/2013  . Trigeminal neuralgia 12/22/2013  . Polyneuropathy in other diseases classified elsewhere ([place]) 12/22/2013  . Abnormality of gait  12/22/2013  . Rib fracture   . Aspiration pneumonia ([place])   . At risk for injury related to fall   . Hx of anxiety disorder   . Hx of major depression   . History of stomach ulcers   . History of kidney stones     40 years ago  . Prostate cancer [facility])     Past Surgical History  Procedure Laterality Date  . Appendectomy    . Joint replacement      Left shoulder, rotator cuff tear repair  . Cataract extraction Right   . Knee surgery Left   . Prostate biopsy  10/19/14  . Lithotripsy      Family History  Problem Relation Age of Onset  . Heart attack Mother   . Heart failure Father   . Heart attack Father   . Cancer - Lung Brother   . Prostate cancer Brother   . Prostate cancer Brother     Social history:  reports that he quit smoking about 36 years ago. His smoking use included Cigarettes. He smoked 2.00 packs per day. He has never used smokeless tobacco. He reports that he does not drink alcohol or use illicit drugs.    Allergies  Allergen Reactions  . Aspirin Other (See Comments)    Burns stomach  . Penicillins   . Sulfa Antibiotics Other (See Comments)  . Carbamazepine Rash  . Codeine Anxiety and Other (See Comments)    Nervousness    Medications:  Prior to Admission medications  Medication Sig Start Date End Date Taking? Authorizing Provider  acetaminophen (TYLENOL) 500 MG tablet Take 1,000 mg by mouth every 6 (six) hours as needed for pain.   Yes Historical Provider, MD  clopidogrel (PLAVIX) 75 MG tablet Take 75 mg by mouth daily.   Yes Historical Provider, MD  fluticasone (FLONASE) 50 MCG/ACT nasal spray Place 1 spray into the nose daily as needed for rhinitis or allergies.    Yes Historical Provider, MD  gabapentin (NEURONTIN) 300 MG capsule Take 1 capsule (300 mg total) by mouth 3 (three) times daily. Patient taking differently: Take 300 mg by mouth 2 (two) times daily.  04/12/14  Yes Kathrynn Ducking, MD  irbesartan (AVAPRO) 300 MG tablet Take 300 mg by mouth  every morning.  10/26/13  Yes Historical Provider, MD  loratadine (CLARITIN) 10 MG tablet Take 10 mg by mouth daily as needed for allergies.    Yes Historical Provider, MD  metoprolol tartrate (LOPRESSOR) 25 MG tablet Take 25 mg by mouth 2 (two) times daily.   Yes Historical Provider, MD  pantoprazole (PROTONIX) 40 MG tablet Take 40 mg by mouth 2 (two) times daily.    Yes Historical Provider, MD  Rivastigmine (EXELON) 13.3 MG/24HR PT24 Place 1 patch onto the skin daily.   Yes Historical Provider, MD    ROS:  Out of a complete 14 system review of symptoms, the patient complains only of the following symptoms, and all other reviewed systems are negative.  Fatigue Hearing loss, runny nose, difficulty swallowing Swollen abdomen Insomnia Difficulty urinating, incontinence of the bladder, frequency of urination Walking difficulty Memory loss, weakness Confusion, depression, anxiety  Blood pressure 116/57, pulse 55, height 5\' 10"  (1.778 m).  Physical Exam  General: The patient is alert and cooperative at the time of the examination.  Skin: 1+ edema of ankles were noted bilaterally..   Neurologic Exam  Mental status: The patient is alert and oriented x 3 at the time of the examination. The Mini-Mental Status Examination done today shows a total score 23/30. The patient is able to name 8 animals in 30 seconds..   Cranial nerves: Facial symmetry is present. Speech is normal, no aphasia or dysarthria is noted. Extraocular movements are full. Visual fields are full.  Motor: The patient has good strength in all 4 extremities, but motor tone is increased in the lower extremities. The patient has relatively full range of movement of the arms and the legs.  Sensory examination: Soft touch sensation is symmetric on the face, arms, and legs. The patient has a stocking pattern pinprick sensory deficit up to the knees bilaterally.  Coordination: The patient has good finger-nose-finger and  heel-to-shin bilaterally.  Gait and station: The patient requires assistance with standing. Once up, he can take a few steps with assistance, gait is wide-based. Romberg is positive, the patient goes backwards. Tandem gait cannot be attempted.  Reflexes: Deep tendon reflexes are symmetric, decreased in the lower extremities.   Assessment/Plan:  1. Gait disorder, thoracic myelopathy  2. Peripheral neuropathy  3. Neurogenic bladder  4. Memory disorder  The patient has evidence of some cord compression at the T8-9 level, cord signal is seen at this level. This may be a contributor factor to his walking problem along with the peripheral neuropathy. The patient is unable to mobilize inside the house, he will require a scooter. A standard wheelchair cannot be utilized given some involvement of the neuropathy with the hands as well. The patient does have good axial stability,  he would be able to manage a scooter physically and cognitively as well. The patient is functionally not able to ambulate without significant assistance. The scooter would increase mobility and safety inside the house. The patient will be set up for physical therapy for gait training and for some mobility assessment as well. He will follow-up in about 6 months, sooner if needed. The family does wish to have a neurosurgical referral for an opinion regarding the thoracic cord impingement.  Jill Alexanders MD 01/04/2015 6:21 PM  [facility] [address] [place], [place] [address]  Phone [phone] Fax [phone]

## 2015-01-11 ENCOUNTER — Telehealth: Payer: Self-pay | Admitting: Neurology

## 2015-01-11 NOTE — Telephone Encounter (Signed)
Pt's daughter calling to advise where the PT referral should be sent. Please call and advise at [phone].

## 2015-01-12 ENCOUNTER — Telehealth: Payer: Self-pay | Admitting: Neurology

## 2015-01-12 DIAGNOSIS — R269 Unspecified abnormalities of gait and mobility: Secondary | ICD-10-CM

## 2015-01-12 DIAGNOSIS — G63 Polyneuropathy in diseases classified elsewhere: Secondary | ICD-10-CM

## 2015-01-12 NOTE — Telephone Encounter (Signed)
Sidney Ace called back and advised to send prescription to [facility] in [place] or [facility] Fax# [phone].

## 2015-01-12 NOTE — Telephone Encounter (Signed)
Patient wanted to go to [facility] faxed order to [place] fax telephone [phone]. Cx order with Neuro rehab. Marland Kitchen

## 2015-01-12 NOTE — Telephone Encounter (Signed)
Daughter Sidney Ace [phone] called to request prescription for seating evaluation for scooter (scooter store in [place]). Codes to use R26.9, G63, Stroke.

## 2015-01-12 NOTE — Telephone Encounter (Signed)
Order faxed to number provided

## 2015-01-12 NOTE — Telephone Encounter (Signed)
Will write an order for this evaluation.

## 2015-01-12 NOTE — Telephone Encounter (Signed)
I called the patient's daughter and left a voicemail asking her to call back and let me know where to send the Rx.

## 2015-01-16 ENCOUNTER — Other Ambulatory Visit: Payer: Self-pay | Admitting: Neurology

## 2015-01-16 DIAGNOSIS — R269 Unspecified abnormalities of gait and mobility: Secondary | ICD-10-CM

## 2015-01-16 DIAGNOSIS — G609 Hereditary and idiopathic neuropathy, unspecified: Secondary | ICD-10-CM

## 2015-02-28 ENCOUNTER — Encounter: Payer: Self-pay | Admitting: Medical Oncology

## 2015-02-28 ENCOUNTER — Telehealth: Payer: Self-pay | Admitting: Medical Oncology

## 2015-02-28 NOTE — Telephone Encounter (Signed)
Oncology Nurse Navigator Documentation  Oncology Nurse Navigator Flowsheets 11/15/2014 11/21/2014 02/28/2015  Referral date to RadOnc/MedOnc - - -  Navigator Encounter Type Clinic/MDC - Telephone;3 month-I spoke with [name]-wife and she stated Jeremy Shea is doing pretty well. He has gained about 10 pounds since he started the androgen deprivation therapy. He saw Dr. Alinda Money in November after labs and a CT. His PSA decreased from 40 to 1.05 and his lymph node had gotten smaller.Jeremy Shea is having some issues with incontinence of urine and stool but this is not related to his cancer. He will follow up in January with Dr. Alinda Money for another injection and labs. Since the lymph node did respond to the ADT he will not get radiation but will continue with the ADT. I asked her to call me if I can be of assistance to them in any way and she voiced understanding.   Patient Visit Type - Surgery;Follow-up -  Treatment Phase - Other -  Barriers/Navigation Needs - No barriers at this time No barriers at this time  Support Groups/Services - Friends and Family Friends and Family  Time Spent with Patient 1 - 31

## 2015-05-15 ENCOUNTER — Encounter: Payer: Self-pay | Admitting: Neurology

## 2015-05-15 ENCOUNTER — Ambulatory Visit (INDEPENDENT_AMBULATORY_CARE_PROVIDER_SITE_OTHER): Payer: Medicare HMO | Admitting: Neurology

## 2015-05-15 VITALS — BP 124/62 | HR 67 | Ht 70.5 in | Wt 203.0 lb

## 2015-05-15 DIAGNOSIS — G63 Polyneuropathy in diseases classified elsewhere: Secondary | ICD-10-CM

## 2015-05-15 DIAGNOSIS — G500 Trigeminal neuralgia: Secondary | ICD-10-CM | POA: Diagnosis not present

## 2015-05-15 DIAGNOSIS — R413 Other amnesia: Secondary | ICD-10-CM

## 2015-05-15 DIAGNOSIS — R269 Unspecified abnormalities of gait and mobility: Secondary | ICD-10-CM

## 2015-05-15 HISTORY — DX: Other amnesia: R41.3

## 2015-05-15 NOTE — Progress Notes (Signed)
Reason for visit: Gait disorder  Jeremy Shea is an 82 y.o. male  History of present illness:  Jeremy Shea is an 82 year old right-handed white male with a history of a peripheral neuropathy and a significant gait disorder. The patient has weakness of the left leg more so than the right. He has developed urinary incontinence, he does have a history of prostate cancer. He has had MRI evaluation of the thoracic spine that shows evidence of epidural lipomatosis and possible myelopathy of the spinal cord at the T8-9 level. The patient has had worsening problems with gait and with control of the bladder, and occasional fecal incontinence. He has fallen on 2 occasions, once in December and once in January 2017. The patient has had both falls in the bathroom. He is forced to walk into the bathroom as the bathroom will not accommodate a wheelchair. He has had some progressive memory problems over the last 7 years, he is on the Exelon patch. He has pain around the left eye that will come and go, he is on gabapentin for this.  Past Medical History  Diagnosis Date  . Hypertension   . Stroke ([place]) 2000  . Prostate disorder   . Dementia   . Neuropathic pain of foot     numbness and weakness of lower extremities  . Complication of anesthesia     hard time waking up  . Memory disorder 12/22/2013  . Trigeminal neuralgia 12/22/2013  . Polyneuropathy in other diseases classified elsewhere ([place]) 12/22/2013  . Abnormality of gait 12/22/2013  . Rib fracture   . Aspiration pneumonia ([place])   . At risk for injury related to fall   . Hx of anxiety disorder   . Hx of major depression   . History of stomach ulcers   . History of kidney stones     40 years ago  . Prostate cancer ([place])   . Memory difficulties 05/15/2015    Past Surgical History  Procedure Laterality Date  . Appendectomy    . Joint replacement      Left shoulder, rotator cuff tear repair  . Cataract extraction Right   . Knee surgery  Left   . Prostate biopsy  10/19/14  . Lithotripsy      Family History  Problem Relation Age of Onset  . Heart attack Mother   . Heart failure Father   . Heart attack Father   . Cancer - Lung Brother   . Prostate cancer Brother   . Prostate cancer Brother     Social history:  reports that he quit smoking about 36 years ago. His smoking use included Cigarettes. He smoked 2.00 packs per day. He has never used smokeless tobacco. He reports that he does not drink alcohol or use illicit drugs.    Allergies  Allergen Reactions  . Aspirin Other (See Comments)    Burns stomach  . Penicillins   . Sulfa Antibiotics Other (See Comments)  . Carbamazepine Rash  . Codeine Anxiety and Other (See Comments)    Nervousness    Medications:  Prior to Admission medications   Medication Sig Start Date End Date Taking? Authorizing Provider  acetaminophen (TYLENOL) 500 MG tablet Take 1,000 mg by mouth every 6 (six) hours as needed for pain.   Yes Historical Provider, MD  clopidogrel (PLAVIX) 75 MG tablet Take 75 mg by mouth daily.   Yes Historical Provider, MD  fluticasone (FLONASE) 50 MCG/ACT nasal spray Place 1 spray into the nose  daily as needed for rhinitis or allergies.    Yes Historical Provider, MD  gabapentin (NEURONTIN) 300 MG capsule Take 1 capsule (300 mg total) by mouth 3 (three) times daily. Patient taking differently: Take 300 mg by mouth 2 (two) times daily.  04/12/14  Yes Kathrynn Ducking, MD  irbesartan (AVAPRO) 300 MG tablet Take 300 mg by mouth every morning.  10/26/13  Yes Historical Provider, MD  loratadine (CLARITIN) 10 MG tablet Take 10 mg by mouth daily as needed for allergies.    Yes Historical Provider, MD  metoprolol tartrate (LOPRESSOR) 25 MG tablet Take 25 mg by mouth 2 (two) times daily.   Yes Historical Provider, MD  pantoprazole (PROTONIX) 40 MG tablet Take 40 mg by mouth 2 (two) times daily.    Yes Historical Provider, MD  Rivastigmine (EXELON) 13.3 MG/24HR PT24 Place 1  patch onto the skin daily.   Yes Historical Provider, MD  UNABLE TO FIND Med Name: Hormone deprivation shot   Yes Historical Provider, MD    ROS:  Out of a complete 14 system review of symptoms, the patient complains only of the following symptoms, and all other reviewed systems are negative.  Eye pain Leg swelling Swollen abdomen, abdominal pain Frequent waking, daytime sleepiness Walking difficulty Bruising easily Memory loss, headache, numbness, weakness Confusion  Blood pressure 124/62, pulse 67, height 5' 10.5" (1.791 m), weight 203 lb (92.08 kg).  Physical Exam  General: The patient is alert and cooperative at the time of the examination.  Skin: 1+ edema below the knees is noted bilaterally.   Neurologic Exam  Mental status: The patient is alert and oriented x 2 at the time of the examination (not oriented to date). The Mini-Mental Status Examination done today shows a total score 27/30. The patient is able to name 7 animals in 30 seconds.   Cranial nerves: Facial symmetry is present. Speech is normal, no aphasia or dysarthria is noted. Extraocular movements are full. Visual fields are full.  Motor: The patient has good strength in all 4 extremities, with exception of 4/5 strength of hip flexion on the left, trace weakness on the right.  Sensory examination: Soft touch sensation is symmetric on the face, arms, and legs.  Coordination: The patient has good finger-nose-finger and heel-to-shin bilaterally.  Gait and station: The patient is able to stand with assistance, once up, he cannot effectively ambulate even with assistance.  Reflexes: Deep tendon reflexes are symmetric, but are depressed.   MRI thoracic 11/08/14:  IMPRESSION:  Abnormal MRI thoracic spine (without) demonstrating: 1. At T7-8: small disc protrusion, posterior epidural lipomatosis, resulting in moderate-severe spinal stenosis; possible subtle T2 and STIR hyperintense signal at this level (and  down to T8-9 level) may represent edema or gliosis 2. At T5-6: ligamentum flavum hypertrophy, posterior epidural lipomatosis, resulting in moderate spinal stenosis; no cord signal abnormality at this level 3. Significant posterior epidural lipomatosis from T1 to T12 levels, which displaces the spinal cord anteriorly. The spinal cord also appears atrophic from T3-4 to T10 levels. 4. Anterior spondylosis and bone bridging with flowing ossifications from C3 to C6 (noted on scout views).  5. No evidence of metastatic bone lesions.  * MRI scan images were reviewed online. I agree with the written report.    Assessment/Plan:  1. Gait disorder  2. Peripheral neuropathy  3. 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 The patient has weakness of the left leg more so than the  right. His gait disorder is likely multifactorial, in part associated with a peripheral neuropathy in part related to spine disease. The patient is an ongoing risk for falls, I will get a home health therapy evaluation with physical and occupational therapy to come to the house to try to improve safety issues. The patient will be referred to neurosurgery for an opinion regarding the possible T8-9 myelopathy. The gabapentin will be cut back to 300 mg twice a day, they are to contact me if the head pain worsens with this medication change. The patient will follow-up in about 6 months.  Jill Alexanders MD 05/15/2015 7:08 PM  [facility] [address] [place], [place] [address]  Phone [phone] Fax [phone]

## 2015-05-15 NOTE — Patient Instructions (Signed)
Fall Prevention in the Home  Falls can cause injuries and can affect people from all age groups. There are many simple things that you can do to make your home safe and to help prevent falls. WHAT CAN I DO ON THE OUTSIDE OF MY HOME?  Regularly repair the edges of walkways and driveways and fix any cracks.  Remove high doorway thresholds.  Trim any shrubbery on the main path into your home.  Use bright outdoor lighting.  Clear walkways of debris and clutter, including tools and rocks.  Regularly check that handrails are securely fastened and in good repair. Both sides of any steps should have handrails.  Install guardrails along the edges of any raised decks or porches.  Have leaves, snow, and ice cleared regularly.  Use sand or salt on walkways during winter months.  In the garage, clean up any spills right away, including grease or oil spills. WHAT CAN I DO IN THE BATHROOM?  Use night lights.  Install grab bars by the toilet and in the tub and shower. Do not use towel bars as grab bars.  Use non-skid mats or decals on the floor of the tub or shower.  If you need to sit down while you are in the shower, use a plastic, non-slip stool..  Keep the floor dry. Immediately clean up any water that spills on the floor.  Remove soap buildup in the tub or shower on a regular basis.  Attach bath mats securely with double-sided non-slip rug tape.  Remove throw rugs and other tripping hazards from the floor. WHAT CAN I DO IN THE BEDROOM?  Use night lights.  Make sure that a bedside light is easy to reach.  Do not use oversized bedding that drapes onto the floor.  Have a firm chair that has side arms to use for getting dressed.  Remove throw rugs and other tripping hazards from the floor. WHAT CAN I DO IN THE KITCHEN?   Clean up any spills right away.  Avoid walking on wet floors.  Place frequently used items in easy-to-reach places.  If you need to reach for something  above you, use a sturdy step stool that has a grab bar.  Keep electrical cables out of the way.  Do not use floor polish or wax that makes floors slippery. If you have to use wax, make sure that it is non-skid floor wax.  Remove throw rugs and other tripping hazards from the floor. WHAT CAN I DO IN THE STAIRWAYS?  Do not leave any items on the stairs.  Make sure that there are handrails on both sides of the stairs. Fix handrails that are broken or loose. Make sure that handrails are as long as the stairways.  Check any carpeting to make sure that it is firmly attached to the stairs. Fix any carpet that is loose or worn.  Avoid having throw rugs at the top or bottom of stairways, or secure the rugs with carpet tape to prevent them from moving.  Make sure that you have a light switch at the top of the stairs and the bottom of the stairs. If you do not have them, have them installed. WHAT ARE SOME OTHER FALL PREVENTION TIPS?  Wear closed-toe shoes that fit well and support your feet. Wear shoes that have rubber soles or low heels.  When you use a stepladder, make sure that it is completely opened and that the sides are firmly locked. Have someone hold the ladder while you   are using it. Do not climb a closed stepladder.  Add color or contrast paint or tape to grab bars and handrails in your home. Place contrasting color strips on the first and last steps.  Use mobility aids as needed, such as canes, walkers, scooters, and crutches.  Turn on lights if it is dark. Replace any light bulbs that burn out.  Set up furniture so that there are clear paths. Keep the furniture in the same spot.  Fix any uneven floor surfaces.  Choose a carpet design that does not hide the edge of steps of a stairway.  Be aware of any and all pets.  Review your medicines with your healthcare provider. Some medicines can cause dizziness or changes in blood pressure, which increase your risk of falling. Talk  with your health care provider about other ways that you can decrease your risk of falls. This may include working with a physical therapist or trainer to improve your strength, balance, and endurance.   This information is not intended to replace advice given to you by your health care provider. Make sure you discuss any questions you have with your health care provider.   Document Released: 03/01/2002 Document Revised: 07/26/2014 Document Reviewed: 04/15/2014 [employer] Interactive Patient Education 2016 [employer]  

## 2015-11-15 ENCOUNTER — Ambulatory Visit (INDEPENDENT_AMBULATORY_CARE_PROVIDER_SITE_OTHER): Payer: Medicare HMO | Admitting: Neurology

## 2015-11-15 ENCOUNTER — Encounter: Payer: Self-pay | Admitting: Neurology

## 2015-11-15 VITALS — BP 126/77 | HR 58 | Resp 20 | Ht 70.0 in | Wt 206.0 lb

## 2015-11-15 DIAGNOSIS — R413 Other amnesia: Secondary | ICD-10-CM

## 2015-11-15 DIAGNOSIS — G500 Trigeminal neuralgia: Secondary | ICD-10-CM

## 2015-11-15 DIAGNOSIS — R269 Unspecified abnormalities of gait and mobility: Secondary | ICD-10-CM

## 2015-11-15 NOTE — Progress Notes (Signed)
Reason for visit: Gait disorder  Jeremy Shea is an 82 y.o. male  History of present illness:  Jeremy Shea is an 82 year old right-handed white male with a history of a multifactorial gait disorder. The patient has a progressive memory disorder over the last 7-8 years. MRI of the brain has shown some cortical atrophy but also some ventriculomegaly. The patient has had MRI evaluation of the low back and thoracic spine. The patient has epidural lipomatosis, he has spinal stenosis with evidence of elevated cord signal at the T7-8 level along with multilevel atrophy of the spinal cord seen. The patient has a neurogenic bladder. He has gotten to the point where he is essentially nonambulatory. He lives at home with his wife, she is having significant difficulty managing him day-to-day. He gets up every hour at night to use the bathroom which is exhausting for her. The patient has weakness of both legs, left greater than right. He has evidence of a peripheral neuropathy diagnosed by nerve conduction studies in 2015. He is felt to have a multifactorial gait disorder. He returns for further evaluation.  Past Medical History:  Diagnosis Date  . Abnormality of gait 12/22/2013  . Aspiration pneumonia ([place])   . At risk for injury related to fall   . Complication of anesthesia    hard time waking up  . Dementia   . History of kidney stones    40 years ago  . History of stomach ulcers   . Hx of anxiety disorder   . Hx of major depression   . Hypertension   . Memory difficulties 05/15/2015  . Memory disorder 12/22/2013  . Neuropathic pain of foot    numbness and weakness of lower extremities  . Polyneuropathy in other diseases classified elsewhere ([place]) 12/22/2013  . Prostate cancer ([place])   . Prostate disorder   . Rib fracture   . Stroke ([place]) 2000  . Trigeminal neuralgia 12/22/2013    Past Surgical History:  Procedure Laterality Date  . APPENDECTOMY    . CATARACT EXTRACTION Right   .  JOINT REPLACEMENT     Left shoulder, rotator cuff tear repair  . KNEE SURGERY Left   . LITHOTRIPSY    . PROSTATE BIOPSY  10/19/14    Family History  Problem Relation Age of Onset  . Heart attack Mother   . Heart failure Father   . Heart attack Father   . Cancer - Lung Brother   . Prostate cancer Brother   . Prostate cancer Brother     Social history:  reports that he quit smoking about 37 years ago. His smoking use included Cigarettes. He smoked 2.00 packs per day. He has never used smokeless tobacco. He reports that he does not drink alcohol or use drugs.    Allergies  Allergen Reactions  . Aspirin Other (See Comments)    Burns stomach  . Penicillins   . Sulfa Antibiotics Other (See Comments)  . Carbamazepine Rash  . Codeine Anxiety and Other (See Comments)    Nervousness    Medications:  Prior to Admission medications   Medication Sig Start Date End Date Taking? Authorizing Provider  acetaminophen (TYLENOL) 500 MG tablet Take 1,000 mg by mouth every 6 (six) hours as needed for pain.   Yes Historical Provider, MD  clopidogrel (PLAVIX) 75 MG tablet Take 75 mg by mouth daily.   Yes Historical Provider, MD  fluticasone (FLONASE) 50 MCG/ACT nasal spray Place 1 spray into the nose daily  as needed for rhinitis or allergies.    Yes Historical Provider, MD  gabapentin (NEURONTIN) 300 MG capsule Take 1 capsule (300 mg total) by mouth 3 (three) times daily. Patient taking differently: Take 300 mg by mouth 2 (two) times daily.  04/12/14  Yes York Spaniel, MD  irbesartan (AVAPRO) 300 MG tablet Take 300 mg by mouth every morning.  10/26/13  Yes Historical Provider, MD  loratadine (CLARITIN) 10 MG tablet Take 10 mg by mouth daily as needed for allergies.    Yes Historical Provider, MD  metoprolol tartrate (LOPRESSOR) 25 MG tablet Take 25 mg by mouth 2 (two) times daily.   Yes Historical Provider, MD  pantoprazole (PROTONIX) 40 MG tablet Take 40 mg by mouth 2 (two) times daily.    Yes  Historical Provider, MD  Rivastigmine (EXELON) 13.3 MG/24HR PT24 Place 1 patch onto the skin daily.   Yes Historical Provider, MD  UNABLE TO FIND Med Name: Hormone deprivation shot   Yes Historical Provider, MD    ROS:  Out of a complete 14 system review of symptoms, the patient complains only of the following symptoms, and all other reviewed systems are negative.  Fatigue Runny nose Cough Leg swelling Cold intolerance Swollen abdomen, abdominal pain Insomnia, daytime sleepiness, sleep talking Walking difficulty Bruising easily  Blood pressure 126/77, pulse (!) 58, resp. rate 20, height 5\' 10"  (1.778 m), weight 206 lb (93.4 kg).  Physical Exam  General: The patient is alert and cooperative at the time of the examination.  Skin: 2+ edema in the legs below the knees is noted bilaterally.   Neurologic Exam  Mental status: The patient is alert and oriented x 2 at the time of the examination (not oriented to date). The Mini-Mental Status Examination done today shows a total score 21/30. The patient is able to name 5 animals in 30 seconds.   Cranial nerves: Facial symmetry is present. Speech is normal, no aphasia or dysarthria is noted. Extraocular movements are full. Visual fields are full.  Motor: The patient has good strength in the upper extremities. With the lower extremity is, the patient has 4 minus/5 strength with hip flexion on the left, 4+/5 on the right. The patient has 4/5 strength with knee extension on the left, near normal strength on the right. The patient has mild bilateral foot drops.  Sensory examination: Soft touch sensation is symmetric on the face and arms. The patient has some decrease in pinprick sensation throughout both legs well into the upper thighs..  Coordination: The patient has good finger-nose-finger bilaterally. The patient is not able to perform heel-to-shin with the left leg, he can perform with the right.  Gait and station: The patient could not  be ambulated, with assistance he can stand, but he has a tendency to lean backwards, cannot initiate ambulation.   Reflexes: Deep tendon reflexes are symmetric, but are depressed.   Assessment/Plan:  1. Multifactorial gait disorder  2. Progressive memory disorder  The patient appears to have a significant gait disorder, the treatment for his safety would be to utilize a wheelchair for mobilization, and not attempt to walk. The patient has a probable thoracic myelopathy, a neurosurgery evaluation was recommended on last visit, the family decided not to pursue this evaluation. The patient also has a peripheral neuropathy. The family wishes to have a second opinion regarding his walking. I will try to get this set up through [facility]. The patient will get physical therapy through [facility]. He will follow-up in  6 months. The purpose of the physical therapy is to improve safety with transfers, not to improve walking.  Jill Alexanders MD 11/15/2015 3:44 PM  [facility] [address] [place], [place] [address]  Phone [phone] Fax [phone]

## 2015-11-15 NOTE — Patient Instructions (Addendum)
We will get a physical therapy in the home and get a second opinion at [facility].  Fall Prevention in the Home  Falls can cause injuries and can affect people from all age groups. There are many simple things that you can do to make your home safe and to help prevent falls. WHAT CAN I DO ON THE OUTSIDE OF MY HOME?  Regularly repair the edges of walkways and driveways and fix any cracks.  Remove high doorway thresholds.  Trim any shrubbery on the main path into your home.  Use bright outdoor lighting.  Clear walkways of debris and clutter, including tools and rocks.  Regularly check that handrails are securely fastened and in good repair. Both sides of any steps should have handrails.  Install guardrails along the edges of any raised decks or porches.  Have leaves, snow, and ice cleared regularly.  Use sand or salt on walkways during winter months.  In the garage, clean up any spills right away, including grease or oil spills. WHAT CAN I DO IN THE BATHROOM?  Use night lights.  Install grab bars by the toilet and in the tub and shower. Do not use towel bars as grab bars.  Use non-skid mats or decals on the floor of the tub or shower.  If you need to sit down while you are in the shower, use a plastic, non-slip stool.Marland Kitchen  Keep the floor dry. Immediately clean up any water that spills on the floor.  Remove soap buildup in the tub or shower on a regular basis.  Attach bath mats securely with double-sided non-slip rug tape.  Remove throw rugs and other tripping hazards from the floor. WHAT CAN I DO IN THE BEDROOM?  Use night lights.  Make sure that a bedside light is easy to reach.  Do not use oversized bedding that drapes onto the floor.  Have a firm chair that has side arms to use for getting dressed.  Remove throw rugs and other tripping hazards from the floor. WHAT CAN I DO IN THE KITCHEN?   Clean up any spills right away.  Avoid walking on wet floors.  Place  frequently used items in easy-to-reach places.  If you need to reach for something above you, use a sturdy step stool that has a grab bar.  Keep electrical cables out of the way.  Do not use floor polish or wax that makes floors slippery. If you have to use wax, make sure that it is non-skid floor wax.  Remove throw rugs and other tripping hazards from the floor. WHAT CAN I DO IN THE STAIRWAYS?  Do not leave any items on the stairs.  Make sure that there are handrails on both sides of the stairs. Fix handrails that are broken or loose. Make sure that handrails are as long as the stairways.  Check any carpeting to make sure that it is firmly attached to the stairs. Fix any carpet that is loose or worn.  Avoid having throw rugs at the top or bottom of stairways, or secure the rugs with carpet tape to prevent them from moving.  Make sure that you have a light switch at the top of the stairs and the bottom of the stairs. If you do not have them, have them installed. WHAT ARE SOME OTHER FALL PREVENTION TIPS?  Wear closed-toe shoes that fit well and support your feet. Wear shoes that have rubber soles or low heels.  When you use a stepladder, make sure that  it is completely opened and that the sides are firmly locked. Have someone hold the ladder while you are using it. Do not climb a closed stepladder.  Add color or contrast paint or tape to grab bars and handrails in your home. Place contrasting color strips on the first and last steps.  Use mobility aids as needed, such as canes, walkers, scooters, and crutches.  Turn on lights if it is dark. Replace any light bulbs that burn out.  Set up furniture so that there are clear paths. Keep the furniture in the same spot.  Fix any uneven floor surfaces.  Choose a carpet design that does not hide the edge of steps of a stairway.  Be aware of any and all pets.  Review your medicines with your healthcare provider. Some medicines can cause  dizziness or changes in blood pressure, which increase your risk of falling. Talk with your health care provider about other ways that you can decrease your risk of falls. This may include working with a physical therapist or trainer to improve your strength, balance, and endurance.   This information is not intended to replace advice given to you by your health care provider. Make sure you discuss any questions you have with your health care provider.   Document Released: 03/01/2002 Document Revised: 07/26/2014 Document Reviewed: 04/15/2014 Elsevier Interactive Patient Education [employer].

## 2015-11-17 ENCOUNTER — Telehealth: Payer: Self-pay

## 2015-11-17 NOTE — Telephone Encounter (Signed)
-----   Message from Kathrynn Ducking, MD sent at 11/15/2015  5:15 PM EDT ----- A home health physical therapy evaluation was ordered. The family has had [facility] previously.

## 2015-11-17 NOTE — Telephone Encounter (Signed)
Home health referral faxed to [facility].

## 2015-11-20 ENCOUNTER — Telehealth: Payer: Self-pay | Admitting: Neurology

## 2015-11-20 NOTE — Telephone Encounter (Signed)
General 11/16/2015 11:49 AM Osborne Casco - -  Note   Sent referral to [facility] @ Dr. Jackelyn Poling.  Fax [phone] # [phone]         Referral was sent on 11-16-2015 I will call [facility] to see where they are in process and call patient's wife on 11-21-2015.

## 2015-11-20 NOTE — Telephone Encounter (Signed)
Wife called regarding referral to [facility], wife requests referral be sent to [facility] [phone].

## 2015-11-22 NOTE — Telephone Encounter (Signed)
Called and left message for Patient 's wife his apt at [facility] with Dr. Dimas Millin is November  27 th arrive at 9:45 am for 10:00 apt. Also relayed there telephone number [phone]. Patient has also been put on CX list . Relayed on message she could call me back if she needed to. Thanks Hinton Dyer .

## 2016-02-18 IMAGING — NM NM BONE WHOLE BODY
2 series · 2 of 2 positions shown · non-contrast
Comparison: No prior nuclear imaging.

CLINICAL DATA: Current history of prostate cancer with elevated PSA
40.37. Surgical history includes left shoulder arthroplasty an
unspecified left knee surgery.

EXAM:
NUCLEAR MEDICINE WHOLE BODY BONE SCAN
TECHNIQUE: Whole body anterior and posterior images were obtained approximately
3 hours after intravenous injection of radiopharmaceutical.
RADIOPHARMACEUTICALS:  27.1 mCi 3echnetium-22m MDP IV

[Series 1: whole body · 2.66mm/px · 1 of 1 slices shown (1 of 2)]
[im 1/1]
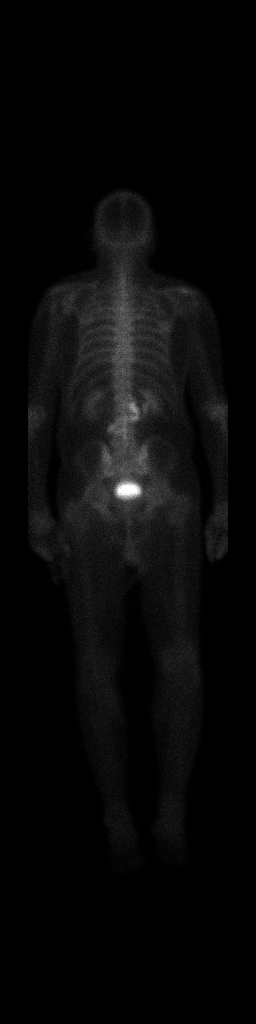

[Series 1: whole body · 2.66mm/px · 1 of 1 slices shown (2 of 2)]
[im 1/1]
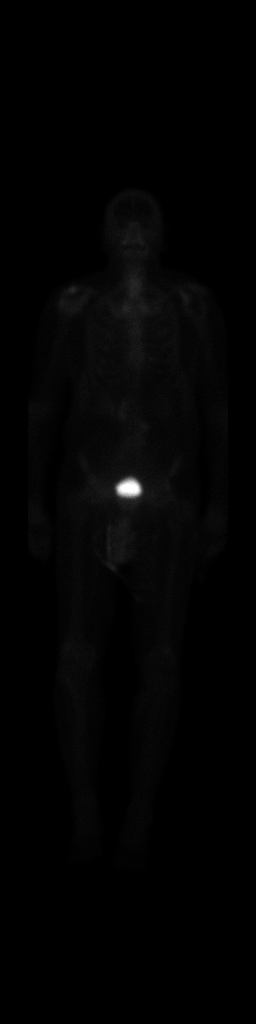

[2 of 2 positions shown; findings below may reference images not displayed]

Bone window images from CT
abdomen and pelvis performed earlier same date are correlated.
FINDINGS: Activity projecting to the right of the L1 and L2 vertebrae and to
the left of the L2 and L3 vertebrae correspond to large bridging
osteophytes at these levels. Degenerative activity is identified in
the right acromioclavicular joint and in both sternoclavicular
joints. No abnormal activity is identified to suggest osseous
metastatic disease. Photopenia is present in the proximal left
humerus related to the shoulder prosthesis. Non-osseous activity
involving the right shoulder may within the capsule or bursa.

Expected excretion is present in the urinary tract. Activity below
the pelvic bone is consistent with urinary incontinence. Activity in
the anterior neck is consistent with uptake of free pertechnetate by
the thyroid gland.
IMPRESSION: 1. No evidence of osseous metastatic disease.
2. Degenerative uptake in the right acromioclavicular joint, both
sternoclavicular joints and in bridging lateral osteophytes at L1-2
and L2-3.

## 2016-03-02 IMAGING — CR DG ABDOMEN 1V
1 series · 1 of 1 positions shown · non-contrast
Comparison: CT 11/04/2014

CLINICAL DATA: Preop right renal stone.

EXAM:
ABDOMEN - 1 VIEW

[t abdomen supine]
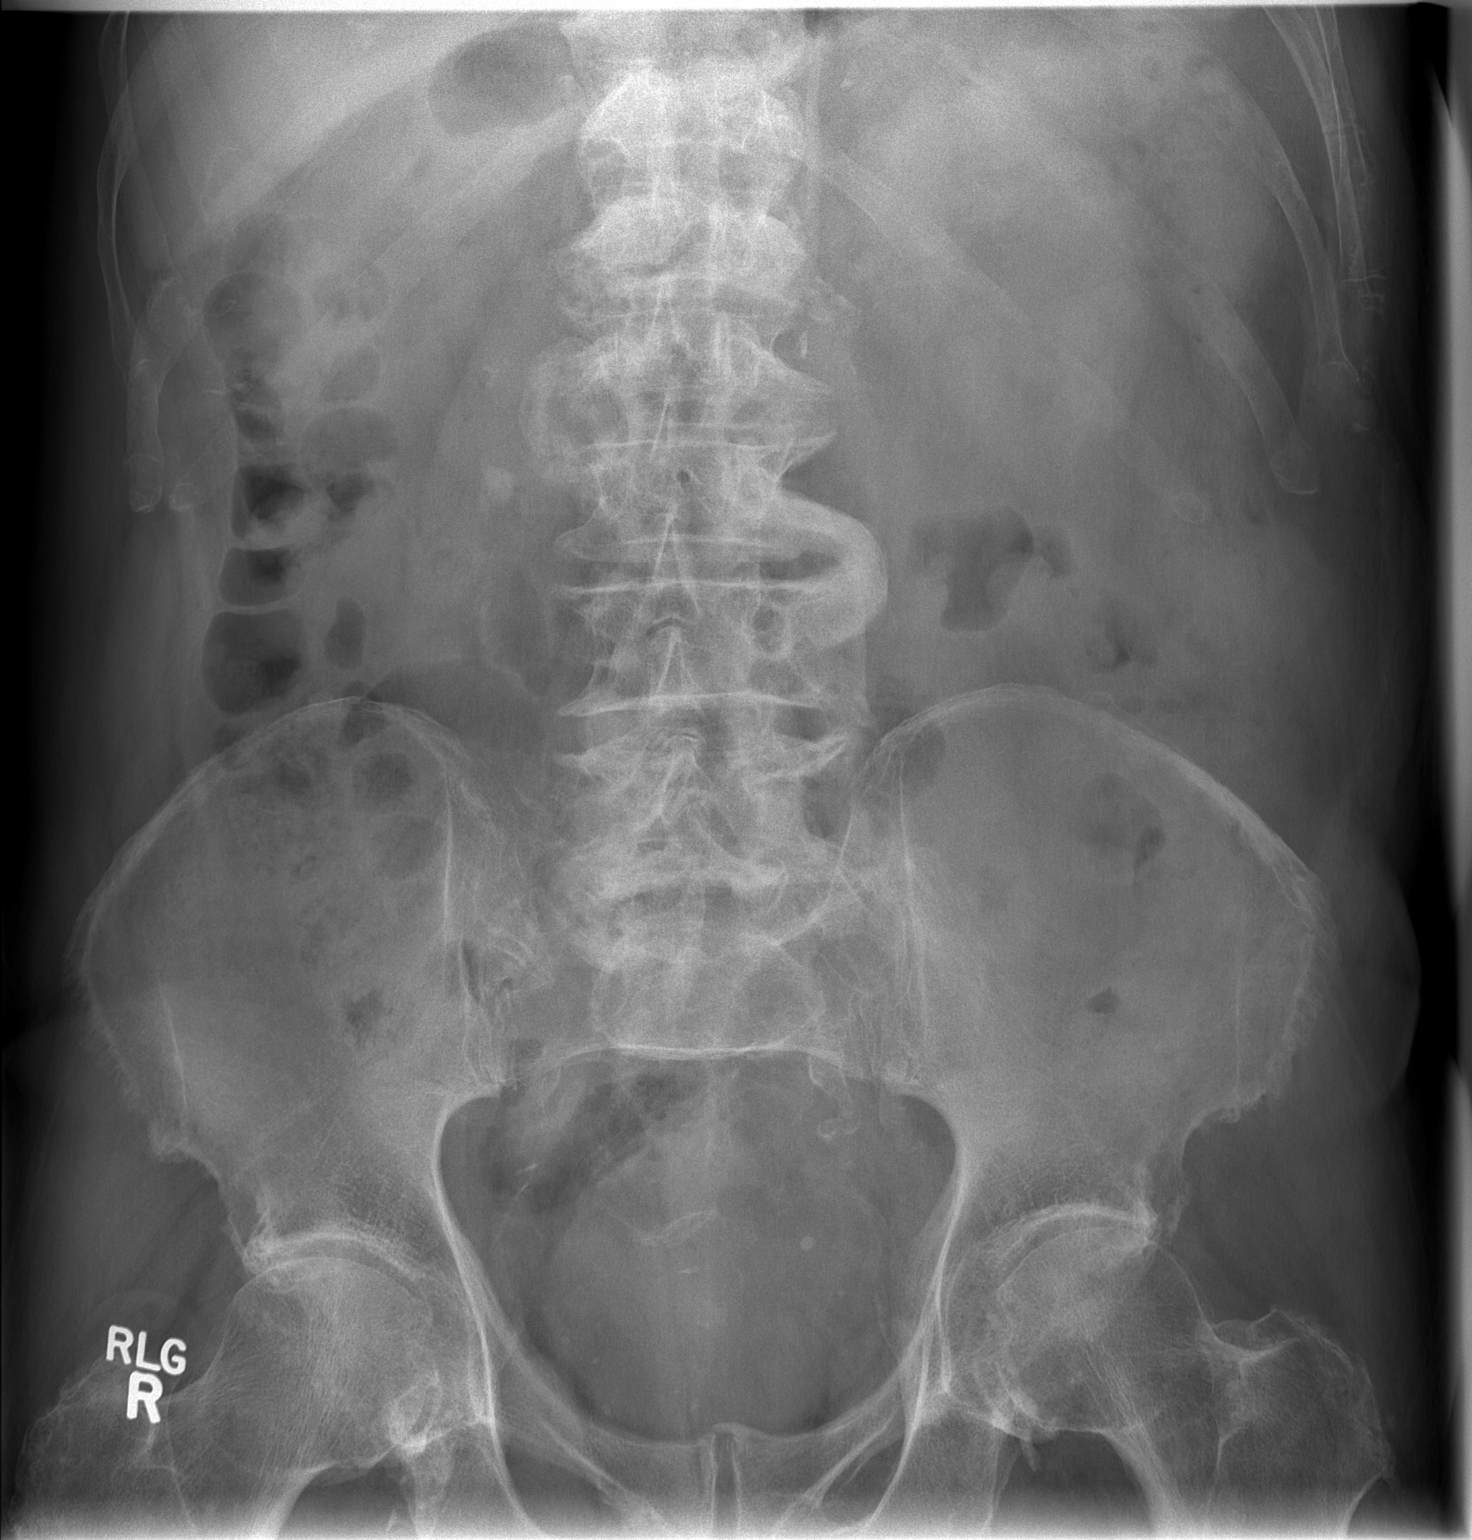

[1 of 1 positions shown; findings below may reference images not displayed]

FINDINGS: Bowel gas pattern is nonobstructive. There is a 1.1 cm calcification
projected over the proximal right ureter compatible patient's known
right-sided ureteral stone. There are a few small pelvic
phleboliths. There is moderate degenerative change of the spine with
prominent paravertebral osteophytes. There are degenerative changes
of the hips and sacroiliac joints.
IMPRESSION: Nonobstructive bowel gas pattern.

1.1 cm stone projected over the region of the proximal right ureter.

## 2016-03-06 IMAGING — CR DG ABDOMEN 1V
1 series · 1 of 1 positions shown · non-contrast
Comparison: [facility] November 17, 2014

CLINICAL DATA: Pre lithotripsy study

EXAM:
ABDOMEN - 1 VIEW

[t abdomen supine]
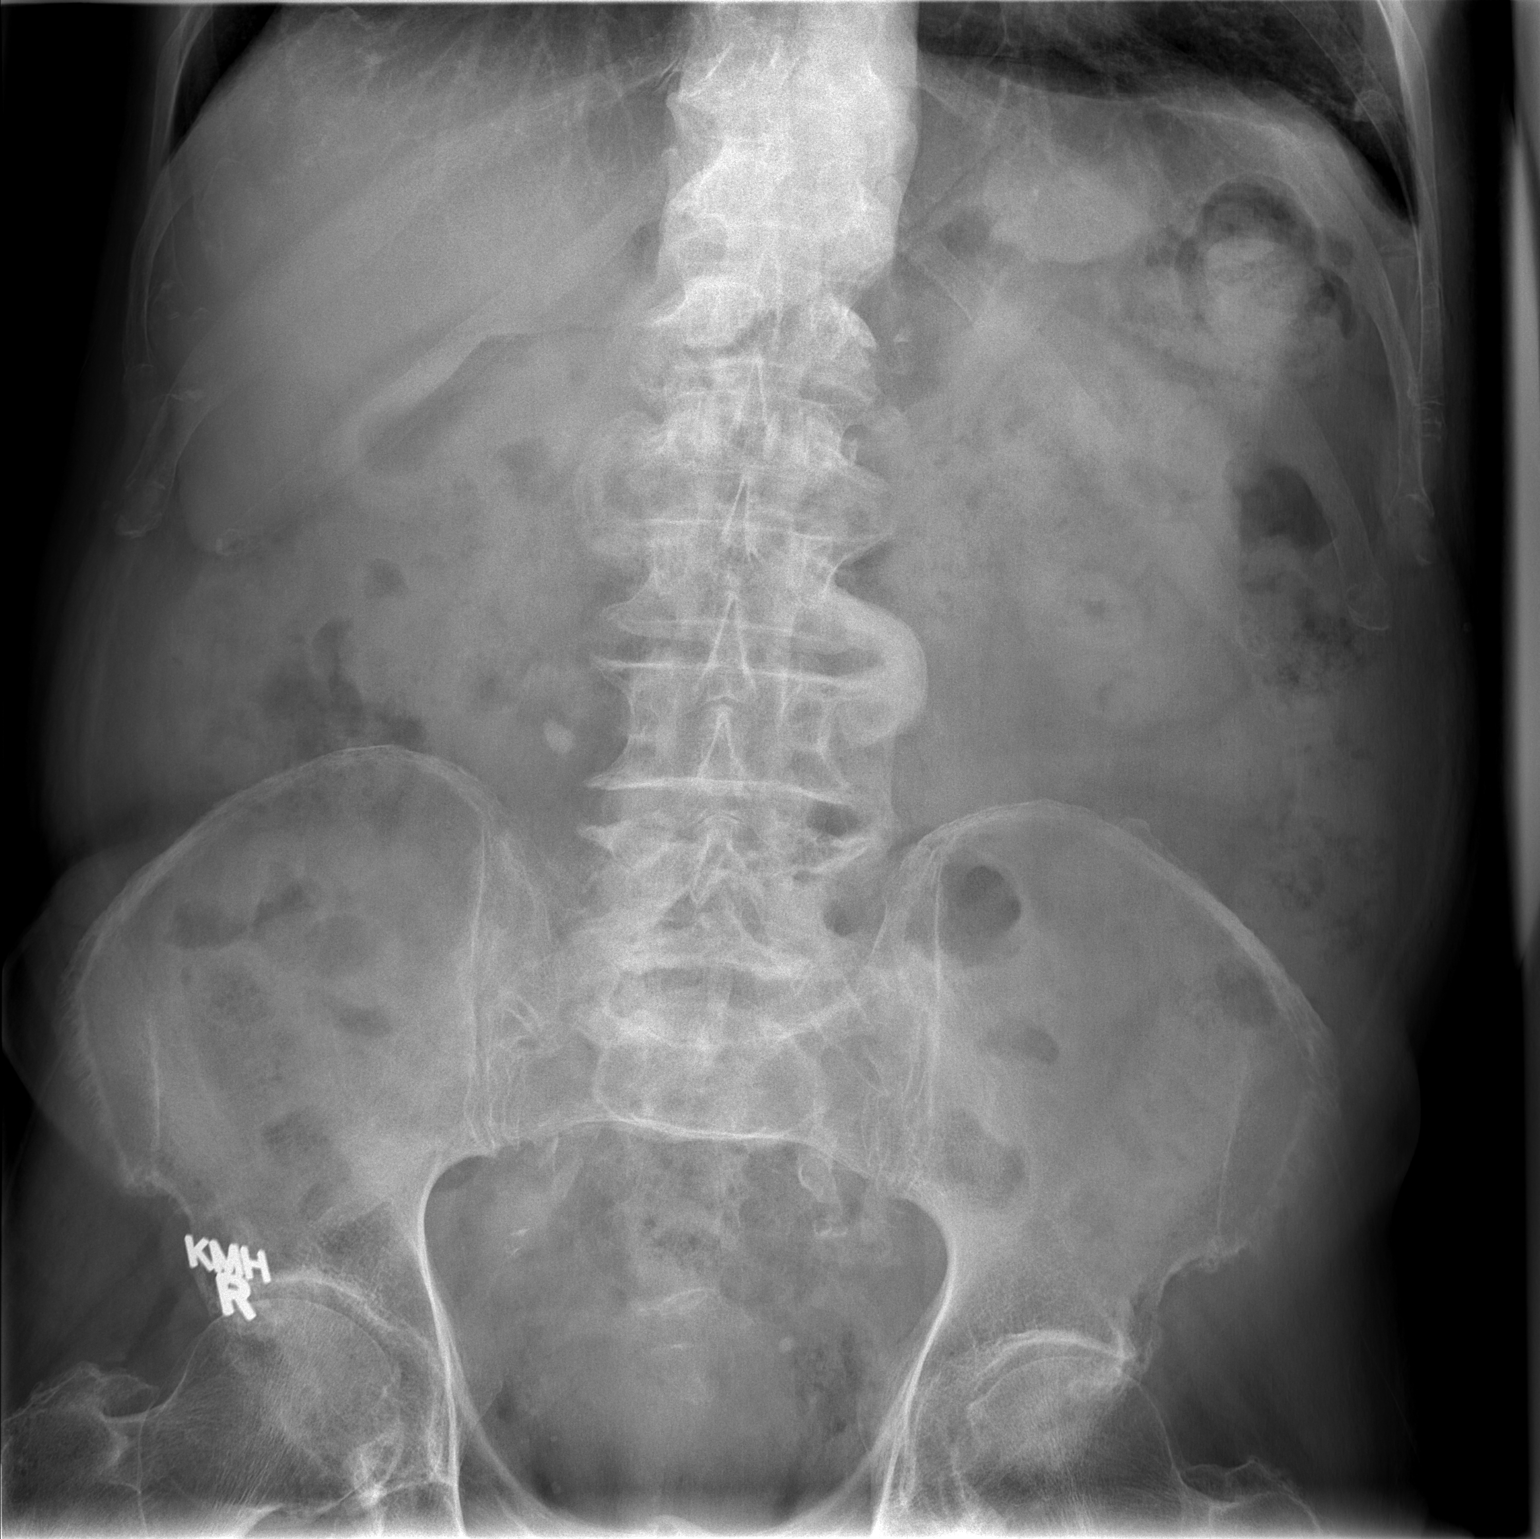

[1 of 1 positions shown; findings below may reference images not displayed]

FINDINGS: The coarse stone demonstrated on the previous study has migrated
distally. It lies just lateral to the body of L4 where previously it
lay along the upper aspect of L3 on the right. No definite other
stones in the right renal collecting system are observed. There are
no stones evident on the left. There is a phlebolith in the left
aspect of the true bony pelvis. There are degenerative changes of
the lumbar spine with large lateral bridging and near bridging
osteophytes. The bowel gas pattern is normal.
IMPRESSION: There has been approximately 4 cm of distal migration of the 11 mm
right proximal ureteral stone.

## 2016-03-12 ENCOUNTER — Telehealth: Payer: Self-pay | Admitting: Oncology

## 2016-03-12 NOTE — Telephone Encounter (Signed)
MAILED RECORDS TO [facility]-RISK ADJUSTMENT °

## 2016-04-24 ENCOUNTER — Other Ambulatory Visit (HOSPITAL_COMMUNITY): Payer: Self-pay | Admitting: Urology

## 2016-04-24 DIAGNOSIS — C61 Malignant neoplasm of prostate: Secondary | ICD-10-CM

## 2016-04-29 ENCOUNTER — Encounter: Payer: Self-pay | Admitting: Oncology

## 2016-04-29 ENCOUNTER — Telehealth: Payer: Self-pay | Admitting: Oncology

## 2016-04-29 NOTE — Telephone Encounter (Signed)
Appt has been scheduled w/Dr. Alen Blew on 2/12 at 930am. Left the appt information on the pt's spouse's phone to confirm. Will mail a letter to the pt.

## 2016-05-01 ENCOUNTER — Telehealth: Payer: Self-pay | Admitting: Oncology

## 2016-05-01 NOTE — Telephone Encounter (Signed)
Pt's wife wanted to reschedule appt. Appt has been rescheduled to 2/15 at 11am per request of the spouse. Aware to arrive at 1030am.

## 2016-05-03 ENCOUNTER — Encounter: Payer: Self-pay | Admitting: Oncology

## 2016-05-06 ENCOUNTER — Telehealth: Payer: Self-pay | Admitting: Medical Oncology

## 2016-05-06 ENCOUNTER — Ambulatory Visit: Payer: Medicare HMO | Admitting: Oncology

## 2016-05-06 NOTE — Progress Notes (Signed)
Mrs. Morale called stating that her husband was seen in the [facility] 02/2015. Dr. Alinda Money has referred him back to Dr. Alen Blew. She states that he has an appointment to see Dr. Alen Blew 2/15 but his CT and bone scan is not until 05/13/16. She asked if it would be best to reschedule the appointment with Dr. Alen Blew until after the 46 th. I will follow up with Dr. Alen Blew and give her a return call.

## 2016-05-07 ENCOUNTER — Telehealth: Payer: Self-pay | Admitting: Medical Oncology

## 2016-05-07 ENCOUNTER — Telehealth: Payer: Self-pay | Admitting: Oncology

## 2016-05-07 NOTE — Telephone Encounter (Signed)
Called and spoke with wife to confirm the change in appointments that his new appointment is 3/1 at 12:00

## 2016-05-07 NOTE — Progress Notes (Signed)
I spoke with Jeremy Shea to inform her that Dr. Alinda Money is rescheduling appointment for her husband from 2/15 to 3/01 at 12:00pm. He will have his scans on 2/19 so he will be able to discuss results at the appointment. She voiced understanding and I asked her to call with any questions or concerns.

## 2016-05-09 ENCOUNTER — Ambulatory Visit: Payer: Medicare HMO | Admitting: Oncology

## 2016-05-13 ENCOUNTER — Encounter (HOSPITAL_COMMUNITY)
Admission: RE | Admit: 2016-05-13 | Discharge: 2016-05-13 | Disposition: A | Discharge status: Home / Self Care | Payer: Medicare HMO | Source: Ambulatory Visit | Attending: Urology | Admitting: Urology

## 2016-05-13 DIAGNOSIS — C61 Malignant neoplasm of prostate: Secondary | ICD-10-CM | POA: Insufficient documentation

## 2016-05-13 MED ORDER — TECHNETIUM TC 99M MEDRONATE IV KIT
25.0000 | PACK | Freq: Once | INTRAVENOUS | Status: AC | PRN
Start: 2016-05-13 — End: 2016-05-13
  Administered 2016-05-13: 20.9 via INTRAVENOUS

## 2016-05-14 ENCOUNTER — Telehealth: Payer: Self-pay | Admitting: Oncology

## 2016-05-14 NOTE — Telephone Encounter (Signed)
Appt has been rescheduled from 3/1 to 2/26 for the pt to see Dr. Alen Blew. Pt's wife agreed to the appt date and time.

## 2016-05-20 ENCOUNTER — Telehealth: Payer: Self-pay | Admitting: Oncology

## 2016-05-20 ENCOUNTER — Telehealth: Payer: Self-pay | Admitting: *Deleted

## 2016-05-20 ENCOUNTER — Encounter: Payer: Self-pay | Admitting: Medical Oncology

## 2016-05-20 ENCOUNTER — Ambulatory Visit (HOSPITAL_BASED_OUTPATIENT_CLINIC_OR_DEPARTMENT_OTHER): Payer: Medicare HMO | Admitting: Oncology

## 2016-05-20 VITALS — BP 97/47 | HR 47 | Temp 98.2°F | Resp 18 | Ht 70.0 in

## 2016-05-20 DIAGNOSIS — C7951 Secondary malignant neoplasm of bone: Secondary | ICD-10-CM

## 2016-05-20 DIAGNOSIS — E291 Testicular hypofunction: Secondary | ICD-10-CM | POA: Diagnosis not present

## 2016-05-20 DIAGNOSIS — F0390 Unspecified dementia without behavioral disturbance: Secondary | ICD-10-CM | POA: Diagnosis not present

## 2016-05-20 DIAGNOSIS — C61 Malignant neoplasm of prostate: Secondary | ICD-10-CM | POA: Diagnosis not present

## 2016-05-20 DIAGNOSIS — R21 Rash and other nonspecific skin eruption: Secondary | ICD-10-CM

## 2016-05-20 MED ORDER — ENZALUTAMIDE 40 MG PO CAPS: 160.0000 mg | ORAL_CAPSULE | Freq: Every day | ORAL | 0 refills | Status: DC

## 2016-05-20 NOTE — Progress Notes (Signed)
Hematology and Oncology Follow Up Visit  Jeremy Shea [id number] [birth year] 83 y.o. 05/20/2016 10:47 AM [name], MDWheless, [name], MD   Principle Diagnosis: 83 year old gentleman with prostate cancer diagnosed in July 2016. He presented with a PSA of 40 and a Gleason score 5+5 = 10 on a prostate biopsy. Staging workup showed pelvic adenopathy in the external iliac area.  Prior Therapy: He was started on androgen deprivation with Lupron and Casodex under the care of Dr. Alinda Money. His PSA dropped initially to a nadir of 1.05 in November 2016. In December 2017 his PSA was up to 11. 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  Current therapy: Lupron and Casodex. Under consideration to start different salvage therapy.  Interim History:  [name] presents today for a follow-up visit. He is a gentleman I saw in consultation in 2016 after presenting with advanced prostate cancer. Since his last visit, he has been treated with androgen deprivation with excellent response initially. His most recent PSA has started to rise and staging workup revealed castration resistant disease. Clinically, he has been debilitated by other health concerns predominantly dementia and perform autopsy. He is very limited in his mobility and uses a motorized scooter for ambulation. He relies on his wife for the majority of activities of daily living. He is able to stand and pivot without assistance but cannot do much otherwise. His appetite has been reasonable without any decline. He does report a left sided chest wall pain but no other complaints. He does report periodic constipation. He denied any other bone pain or weight loss. Do not any dysphagia without odynophagia.   He does not report any fevers, chills, sweats or weight loss or appetite changes. He does not report any chest pain, palpitation, orthopnea or leg edema. He does not report any cough, wheezing,  hemoptysis or dyspnea on exertion. He does not report any nausea, vomiting, abdominal pain, hematochezia or melanoma. He does not report any skeletal complaints. He does not report any lymphadenopathy or petechiae. Remaining review of systems unremarkable.  Medications: I have reviewed the patient's current medications.  Current Outpatient Prescriptions  Medication Sig Dispense Refill  . acetaminophen (TYLENOL) 500 MG tablet Take 1,000 mg by mouth every 6 (six) hours as needed for pain.    Marland Kitchen clopidogrel (PLAVIX) 75 MG tablet Take 75 mg by mouth daily.    . fluticasone (FLONASE) 50 MCG/ACT nasal spray Place 1 spray into the nose daily as needed for rhinitis or allergies.     Marland Kitchen loratadine (CLARITIN) 10 MG tablet Take 10 mg by mouth daily as needed for allergies.     . metoprolol tartrate (LOPRESSOR) 25 MG tablet Take 25 mg by mouth 2 (two) times daily.    . pantoprazole (PROTONIX) 40 MG tablet Take 40 mg by mouth 2 (two) times daily.     . Rivastigmine (EXELON) 13.3 MG/24HR PT24 Place 1 patch onto the skin daily.    Marland Kitchen UNABLE TO FIND Med Name: Hormone deprivation shot    . enzalutamide (XTANDI) 40 MG capsule Take 4 capsules (160 mg total) by mouth daily. 120 capsule 0   No current facility-administered medications for this visit.      Allergies:  Allergies  Allergen Reactions  . Carbamazepine Rash and Other (See Comments)    Mental status changes  . Codeine Anxiety and Other (See Comments)    Nervousness. Mental status changes.  . Penicillins Other (See Comments)    Makes him jittery  .  Aspirin Other (See Comments)    Burns stomach  . Sulfa Antibiotics Rash    Past Medical History, Surgical history, Social history, and Family History were reviewed and updated.   Remaining ROS negative. Physical Exam: Blood pressure (!) 97/47, pulse (!) 47, temperature 98.2 F (36.8 C), temperature source Oral, resp. rate 18, height 5\' 10"  (1.778 m), SpO2 99 %. ECOG: 2 General appearance: alert  and cooperative Head: Normocephalic, without obvious abnormality Neck: no adenopathy Lymph nodes: Cervical, supraclavicular, and axillary nodes normal. Heart:regular rate and rhythm, S1, S2 normal, no murmur, click, rub or gallop Lung:chest clear, no wheezing, rales, normal symmetric air entry Abdomin: soft, non-tender, without masses or organomegaly EXT:no erythema, induration, or nodules Skin: Faint erythema noted bilaterally.  Lab Results: Lab Results  Component Value Date   WBC 11.7 (H) 09/06/2012   HGB 9.4 (L) 09/06/2012   HCT 25.9 (L) 09/06/2012   MCV 97.4 09/06/2012   PLT 199 09/06/2012     Chemistry      Component Value Date/Time   NA 136 09/04/2012 1708   K 4.2 09/04/2012 1708   CL 102 09/04/2012 1708   CO2 25 09/04/2012 1708   BUN 23 09/04/2012 1708   CREATININE 0.85 09/04/2012 2245      Component Value Date/Time   CALCIUM 8.8 09/04/2012 1708       Radiological Studies:  IMPRESSION: Foci of abnormal increased uptake in BILATERAL ribs, in the thoracic spine at T10, and at the lateral RIGHT tibial plateau, cannot exclude metastases, though there does appear to be a fracture at the lateral LEFT ninth rib.  Radiographic correlation of the RIGHT knee is recommended to exclude metastatic lesion of the RIGHT tibia.  Impression and Plan:  83 year old gentleman with the following issues:  1. 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 His initial diagnosis in 2016 with a Gleason score of 10 and a PSA of 40. He was initially treated with androgen deprivation only with excellent response initially but in December 2017 showed a rise in his PSA up to 11.   CT scan and bone scan obtained on 05/13/2016 were personally reviewed and discussed with the patient and his family today. His CT scan showed a 2.7 cm soft tissue mass in the right recto-prostatic angle. His bone scan did show foci of abnormal uptake in bilateral  ribs and the thoracic spine.  The natural course of castration resistant metastatic prostate cancer was discussed with the patient and his family. He understand he has an incurable malignancy and any treatment at this time would be palliative. Any treatment would be complicated because of his comorbid conditions that includes chronic peripheral neuropathy and dementia. These options were reviewed today which include Nicki Reaper and systemic chemotherapy. Provenge immunotherapy and Trudi Ida are also a consideration.  After discussing the options today and wane risks and benefits, Gillermina Phy is a good choice for him. Complications associated with this medication were discussed today includes fatigue, nausea, lower action of any edema and rarely seizures. After discussion, he is agreeable to try this medication for the time being. Further dose reduction might be required if he cannot tolerate the current dose.  2. Androgen depravation: I have recommended continuing Lupron indefinitely. I'm happy to give it to him at the [facility] in the future if he chooses to do so. I recommended discontinuation of Casodex at this time.  3. Bone directed therapy: I will defer this option at this time given the  limited bony metastasis noted.  4. Skin rash: I doubt this is related to Lupron but he had been started on Namenda which could be contributing. I recommended follow-up with neurology regarding this possible cause of his rash.  5. Dementia: It has limited his performance status and his quality of life at this time. Appears to be stable overall per his family's report.  6. Follow-up: The in the next month or so to follow on his status after starting Xtandi.   [facility], MD 2/26/201810:47 AM

## 2016-05-20 NOTE — Telephone Encounter (Signed)
Appointments scheduled per 2/26 LOS. Patient given AVS report and calendars with future scheduled appointments. °

## 2016-05-20 NOTE — Progress Notes (Signed)
I met Jeremy Shea in the [facility] 11/15/14. He has been referred back to Dr. Alen Blew for castrate resistant prostate cancer. He is being prescribed Xtandi by Dr. Alen Blew. Educational materials given to the wife and daughters.

## 2016-05-20 NOTE — Telephone Encounter (Signed)
Was notified by scheduling that wife had questions regarding Xtandi. I left a message on cell phone that she does not need to worry about insurance coverage, prior authorization or what pharmacy will fill it. I told her [place] oral chemotherapy pharmacist will be working to obtain all of this. The pharmacist will call the wife, Jeremy Shea, (the husband has dementia), on cell phone [phone]. Instructed her to call back if she had any questions or concerns.

## 2016-05-21 ENCOUNTER — Ambulatory Visit: Payer: Medicare HMO | Admitting: Neurology

## 2016-05-21 ENCOUNTER — Telehealth: Payer: Self-pay | Admitting: Pharmacist

## 2016-05-21 DIAGNOSIS — C61 Malignant neoplasm of prostate: Secondary | ICD-10-CM

## 2016-05-21 NOTE — Telephone Encounter (Signed)
Oral Chemotherapy Pharmacist Encounter  Received new prescription for Xtandi for castration-resistant metastatic prostate cancer  No recent labs in Epic for review  Current medication list in Epic assessed, some DDIs with Gillermina Phy identified:  Xtandi and pantoprazole: category D interaction: as Gillermina Phy is a moderate inducer of CYP2C19, it may decrease serum concentrations of pantoprazole, which is a substrate of CYP2C19. This has little clinical significance, no change to current therapy is indicated  Xtandi and Plavix: category C interaction: as Plavix is an inhibitor of CYP2C8, it will decrease the metabolism of Xtandi, which is a substrate of CYP2C8, leading to possible increased exposure to the [place]. No change to therapy is indicated at this time, Xtandi side effects will be monitored.  Prior authorization has been submitted on CoverMymeds Key [id number] Status is pending  Once PA is obtained, we will send prescription to [place] as that is preferred pharmacy per patient's insurance.  Oral Oncology Clinic will continue to follow  Jeremy Shea, PharmD, BCPS, BCOP 05/21/2016  9:21 AM Oral Oncology Clinic [phone]

## 2016-05-22 NOTE — Telephone Encounter (Signed)
Oral Chemotherapy Pharmacist Encounter  Received notification from [facility] that prior authorization for patient's Gillermina Phy has been approved Referral# [id number] Effective dates: 03/23/16-03/24/17  Notice of PA approval, Xtandi prescription, clinical and demographic info faxed to [place] [facility] will continue to follow.  Johny Drilling, PharmD, BCPS, BCOP 05/22/2016  11:16 AM Oral Oncology Clinic [phone]

## 2016-05-23 ENCOUNTER — Ambulatory Visit: Payer: Medicare HMO | Admitting: Oncology

## 2016-05-28 NOTE — Telephone Encounter (Signed)
Oral Chemotherapy Pharmacist Encounter  Received call from patient's wife, Mechele Claude, with request of Xtandi status update, they have not yet heard from a pharmacy to schedule delivery of the medication. I provided Mechele Claude with the phone number to [facility] of [phone] for status update as we sent prescription there on 05/22/16 after obtaining PA approval from patient's insurance. I instructed Mechele Claude to call me back if she needs any additional support obtaining the Xtabdi.  Oral Oncology Clinic will continue to follow.  Johny Drilling, PharmD, BCPS, BCOP 05/28/2016  12:17 PM Oral Oncology Clinic [phone]

## 2016-05-29 ENCOUNTER — Telehealth: Payer: Self-pay | Admitting: Pharmacist

## 2016-05-29 NOTE — Telephone Encounter (Signed)
Oral Chemotherapy Pharmacist Encounter  Received call from patient's wife, Mechele Claude, that [facility] copayment is prohibitively expensive. We will start [employer] application to try to obtain Xtandi at [place] cost from the manufacturer. Mechele Claude will call me back later today to schedule a time for patient to sign application.  This encounter will continue to be updated until final determination.  Oral Oncology Clinic will continue to follow.   Johny Drilling, PharmD, BCPS, BCOP 05/29/2016  10:58 AM Oral Oncology Clinic [phone]

## 2016-05-29 NOTE — Telephone Encounter (Signed)
Oral Chemotherapy Pharmacist Encounter  Completed application for XSS faxed to [phone]  This encounter will continue to be updated until final determination.  Oral Oncology Clinic will continue to follow.   Jeremy Shea, PharmD, BCPS, BCOP 05/29/2016  4:35 PM Oral Oncology Clinic [phone]

## 2016-05-29 NOTE — Telephone Encounter (Signed)
Oral Chemotherapy Pharmacist Encounter  Received call from patient's wife, Jeremy Shea, that she had spoken with [place] and patient's Xtandi copay [phone], this is prohibitively expensive for the patient. There are currently no foundation grants for copayment support for patient's disease state. We will begin manufacturer application process and use a separate encounter for documentation of assistance.   Johny Drilling, PharmD, BCPS, BCOP 05/29/2016  9:56 AM Oral Oncology Clinic [phone]

## 2016-06-04 ENCOUNTER — Telehealth: Payer: Self-pay | Admitting: Pharmacist

## 2016-06-04 MED ORDER — ENZALUTAMIDE 40 MG PO CAPS: 160.0000 mg | ORAL_CAPSULE | Freq: Every day | ORAL | 0 refills | Status: DC

## 2016-06-04 MED FILL — XTANDI 40 MG CAPSULE: 40 | 30 days supply | Qty: 120 | Fill #0

## 2016-06-04 NOTE — Telephone Encounter (Signed)
Oral Chemotherapy Pharmacist Encounter  Successfully enrolled patient for copayment assistance funds from [facility]) from the metastatic prostate cancer fund. Award amount: $7500 Effective dates: 03/05/16 - 06/02/17 ID: [id number]  BIN: [id number] Group: [id number] PCN: [id number]  Billing information will be faxed to [facility]. I will place a copy of the award letter to be scanned into patient's chart.  Johny Drilling, PharmD, BCPS, BCOP 06/04/2016 10:00 AM Oral Oncology Clinic [phone]

## 2016-06-04 NOTE — Telephone Encounter (Signed)
Oral Chemotherapy Pharmacist Encounter  I was able to enroll patient for copayment grant from [facility] (documented in a separate encounter) for $7500. Xtandi prescription and PANF billing information will be sent to [facility] for filling.  I LVM for patient's wife, Mechele Claude, with good news and plan for prescription.  Oral Oncology Clinic will continue to follow.  Johny Drilling, PharmD, BCPS, BCOP 06/04/2016  10:07 AM Oral Oncology Clinic [phone]

## 2016-06-11 NOTE — Telephone Encounter (Signed)
Per [facility], Jeremy Shea has been mailed to pt.  Kennith Center, Pharm.D., CPP 06/11/2016@1 :[facility]

## 2016-06-13 ENCOUNTER — Telehealth: Payer: Self-pay | Admitting: Medical Oncology

## 2016-06-13 NOTE — Progress Notes (Signed)
[name]-wife called, left a message stating that her husband was seen by Dr. Alen Blew about three weeks ago and prescribed Xtandi, He did not start Xtandi until 06/06/16. He has  follow up with Dr. Alen Blew 06/25/16. She asked if we should move his follow up appointment out a couple of weeks due to start date of Xtandi. I forwarded this message to [facility]- Dr. Hazeline Junker nurse and she will follow up with Dr. Alen Blew.

## 2016-06-13 NOTE — Telephone Encounter (Signed)
This RN called and spoke to wife regarding appointments. Per Dr. Alen Blew, keep the April appointments as scheduled. Wife verbalized understanding.

## 2016-06-17 ENCOUNTER — Ambulatory Visit: Payer: Medicare HMO | Admitting: Oncology

## 2016-06-25 ENCOUNTER — Ambulatory Visit (HOSPITAL_BASED_OUTPATIENT_CLINIC_OR_DEPARTMENT_OTHER): Payer: Medicare HMO | Admitting: Oncology

## 2016-06-25 ENCOUNTER — Telehealth: Payer: Self-pay | Admitting: Hematology

## 2016-06-25 ENCOUNTER — Other Ambulatory Visit (HOSPITAL_BASED_OUTPATIENT_CLINIC_OR_DEPARTMENT_OTHER): Payer: Medicare HMO

## 2016-06-25 VITALS — BP 102/55 | HR 54 | Temp 97.8°F | Resp 18 | Ht 70.0 in | Wt 181.8 lb

## 2016-06-25 DIAGNOSIS — C61 Malignant neoplasm of prostate: Secondary | ICD-10-CM

## 2016-06-25 DIAGNOSIS — E291 Testicular hypofunction: Secondary | ICD-10-CM | POA: Diagnosis not present

## 2016-06-25 DIAGNOSIS — C7951 Secondary malignant neoplasm of bone: Secondary | ICD-10-CM | POA: Diagnosis not present

## 2016-06-25 DIAGNOSIS — R21 Rash and other nonspecific skin eruption: Secondary | ICD-10-CM

## 2016-06-25 LAB — CBC WITH DIFFERENTIAL/PLATELET
BASO%: 0.7 % (ref 0.0–2.0)
Basophils Absolute: 0.1 10*3/uL (ref 0.0–0.1)
EOS%: 7.2 % — AB (ref 0.0–7.0)
Eosinophils Absolute: 0.9 10*3/uL — ABNORMAL HIGH (ref 0.0–0.5)
HCT: 33.4 % — ABNORMAL LOW (ref 38.4–49.9)
HGB: 11.6 g/dL — ABNORMAL LOW (ref 13.0–17.1)
LYMPH%: 18.7 % (ref 14.0–49.0)
MCH: 34.9 pg — AB (ref 27.2–33.4)
MCHC: 34.8 g/dL (ref 32.0–36.0)
MCV: 100.4 fL — ABNORMAL HIGH (ref 79.3–98.0)
MONO#: 1.3 10*3/uL — ABNORMAL HIGH (ref 0.1–0.9)
MONO%: 10.2 % (ref 0.0–14.0)
NEUT%: 63.2 % (ref 39.0–75.0)
NEUTROS ABS: 8.1 10*3/uL — AB (ref 1.5–6.5)
Platelets: 289 10*3/uL (ref 140–400)
RBC: 3.32 10*6/uL — AB (ref 4.20–5.82)
RDW: 14.1 % (ref 11.0–14.6)
WBC: 12.7 10*3/uL — AB (ref 4.0–10.3)
lymph#: 2.4 10*3/uL (ref 0.9–3.3)

## 2016-06-25 LAB — COMPREHENSIVE METABOLIC PANEL
ALK PHOS: 127 U/L (ref 40–150)
ALT: 16 U/L (ref 0–55)
AST: 23 U/L (ref 5–34)
Albumin: 3.3 g/dL — ABNORMAL LOW (ref 3.5–5.0)
Anion Gap: 8 mEq/L (ref 3–11)
BUN: 11 mg/dL (ref 7.0–26.0)
CO2: 23 meq/L (ref 22–29)
Calcium: 9.2 mg/dL (ref 8.4–10.4)
Chloride: 103 mEq/L (ref 98–109)
Creatinine: 1 mg/dL (ref 0.7–1.3)
EGFR: 72 mL/min/{1.73_m2} — AB (ref >90)
GLUCOSE: 126 mg/dL (ref 70–140)
POTASSIUM: 3.9 meq/L (ref 3.5–5.1)
SODIUM: 135 meq/L — AB (ref 136–145)
Total Bilirubin: 0.42 mg/dL (ref 0.20–1.20)
Total Protein: 6.7 g/dL (ref 6.4–8.3)

## 2016-06-25 NOTE — Progress Notes (Signed)
Hematology and Oncology Follow Up Visit  Jeremy Shea [id number] 1931/02/03 83 y.o. 06/25/2016 3:32 PM Jeremy Shea, MDWheless, Jeremy Rinks, MD   Principle Diagnosis: 83 year old gentleman with prostate cancer diagnosed in July 2016. He presented with a PSA of 40 and a Gleason score 5+5 = 10 on a prostate biopsy. Staging workup showed pelvic adenopathy in the external iliac area.  Prior Therapy: He was started on androgen deprivation with Lupron and Casodex under the care of Dr. Alinda Shea. His PSA dropped initially to a nadir of 1.05 in November 2016.  In December 2017 his PSA was up to 11. 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  Current therapy: Xtandi 160 mg daily started on 06/06/2016.  Interim History:  Jeremy Shea presents today for a follow-up visit. Since his last visit, he started [place] and have tolerated it well. He has been taken for the last 3 weeks without any major complications. He denied any excessive fatigue, tiredness or appetite changes. He did have loose bowel movements which unclear if is related to his Xtandi. His performance status remains limited related due to his disability and currently limited to motorized scooter. He does have dementia and memory issues which have not changed since the last visit. Her blood pressure remains under reasonable control.  He does not report any fevers, chills, sweats or weight loss or appetite changes. He does not report any chest pain, palpitation, orthopnea or leg edema. He does not report any cough, wheezing, hemoptysis or dyspnea on exertion. He does not report any nausea, vomiting, abdominal pain, hematochezia or melanoma. He does not report any skeletal complaints. He does not report any lymphadenopathy or petechiae. Remaining review of systems unremarkable.  Medications: I have reviewed the patient's current medications.  Current Outpatient Prescriptions  Medication Sig Dispense  Refill  . acetaminophen (TYLENOL) 500 MG tablet Take 1,000 mg by mouth every 6 (six) hours as needed for pain.    Marland Kitchen clopidogrel (PLAVIX) 75 MG tablet Take 75 mg by mouth daily.    . enzalutamide (XTANDI) 40 MG capsule Take 4 capsules (160 mg total) by mouth daily. 120 capsule 0  . fluticasone (FLONASE) 50 MCG/ACT nasal spray Place 1 spray into the nose daily as needed for rhinitis or allergies.     Marland Kitchen loratadine (CLARITIN) 10 MG tablet Take 10 mg by mouth daily as needed for allergies.     . metoprolol tartrate (LOPRESSOR) 25 MG tablet Take 25 mg by mouth 2 (two) times daily.    . pantoprazole (PROTONIX) 40 MG tablet Take 40 mg by mouth 2 (two) times daily.     . Rivastigmine (EXELON) 13.3 MG/24HR PT24 Place 1 patch onto the skin daily.    Marland Kitchen UNABLE TO FIND Med Name: Hormone deprivation shot     No current facility-administered medications for this visit.      Allergies:  Allergies  Allergen Reactions  . Carbamazepine Rash and Other (See Comments)    Mental status changes  . Codeine Anxiety and Other (See Comments)    Nervousness. Mental status changes.  . Penicillins Other (See Comments)    Makes him jittery  . Aspirin Other (See Comments)    Burns stomach  . Sulfa Antibiotics Rash    Past Medical History, Surgical history, Social history, and Family History were reviewed and updated.   Remaining ROS negative. Physical Exam: Blood pressure (!) 102/55, pulse (!) 54, temperature 97.8 F (36.6 C), temperature source Oral, resp. rate 18, height 5'  10" (1.778 m), weight 181 lb 12.8 oz (82.5 kg), SpO2 99 %. ECOG: 2 General appearance: alert and cooperative appeared without distress. Head: Normocephalic, without obvious abnormality.oral ulcers or lesions. Neck: no adenopathy Lymph nodes: Cervical, supraclavicular, and axillary nodes normal. Heart:regular rate and rhythm, S1, S2 normal, no murmur, click, rub or gallop Lung:chest clear, no wheezing, rales, normal symmetric air  entry Abdomin: soft, non-tender, without masses or organomegaly no rebound or guarding. EXT:no erythema, induration, or nodules Skin: No rashes or lesions.  Lab Results: Lab Results  Component Value Date   WBC 12.7 (H) 06/25/2016   HGB 11.6 (L) 06/25/2016   HCT 33.4 (L) 06/25/2016   MCV 100.4 (H) 06/25/2016   PLT 289 06/25/2016     Chemistry      Component Value Date/Time   NA 136 09/04/2012 1708   K 4.2 09/04/2012 1708   CL 102 09/04/2012 1708   CO2 25 09/04/2012 1708   BUN 23 09/04/2012 1708   CREATININE 0.85 09/04/2012 2245      Component Value Date/Time   CALCIUM 8.8 09/04/2012 1708       Radiological Studies:  IMPRESSION: Foci of abnormal increased uptake in BILATERAL ribs, in the thoracic spine at T10, and at the lateral RIGHT tibial plateau, cannot exclude metastases, though there does appear to be a fracture at the lateral LEFT ninth rib.  Radiographic correlation of the RIGHT knee is recommended to exclude metastatic lesion of the RIGHT tibia.  Impression and Plan:  83 year old gentleman with the following issues:  1. 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 His initial diagnosis in 2016 with a Gleason score of 10 and a PSA of 40. He was initially treated with androgen deprivation only with excellent response initially but in December 2017 showed a rise in his PSA up to 11.   CT scan and bone scan obtained on 05/13/2016 showed a 2.7 cm soft tissue mass in the right recto-prostatic angle. His bone scan did show foci of abnormal uptake in bilateral ribs and the thoracic spine.  He is currently on Xtandi 160 mg daily started in March 2018. He does not report any major complications or side effects associated with this medication. The plan is to continue with the same dose and schedule and monitor his PSA periodically. PSA is currently pending from today and will be monitored with every visit.  3. Bone  directed therapy: I will defer this option at this time given the limited bony metastasis noted.  4. Skin rash: Not dramatically changed at this time. Unrelated to [place].  5. Dementia: It has limited his performance status and his quality of life at this time. Appears to be stable overall per his family's report.  6. Androgen depravation: I recommended continuing this indefinitely. He is currently receiving at [facility] urology.  7. Follow-up: The in the next 4 weeks to follow on his progress.   Zola Button, MD 4/3/20183:32 PM

## 2016-06-25 NOTE — Telephone Encounter (Signed)
Gave patient avs report and appointments for April. Time per patient family not able to come for 1:15pm f/u with lab prior needs after 2pm.

## 2016-06-26 ENCOUNTER — Telehealth: Payer: Self-pay | Admitting: *Deleted

## 2016-06-26 LAB — PSA: Prostate Specific Ag, Serum: 20.7 ng/mL — ABNORMAL HIGH (ref 0.0–4.0)

## 2016-06-26 NOTE — Telephone Encounter (Signed)
-----   Message from Wyatt Portela, MD sent at 06/26/2016  8:42 AM EDT ----- Please let him know his PSA. He is to continue [place] as discussed. It's too soon to see  PSA change.

## 2016-06-26 NOTE — Telephone Encounter (Signed)
As noted below by Dr. Alen Blew, I talked with the wife and informed her of the PSA level. Told her that he needs to continue taking Xtandi as discussed with Dr. Alen Blew. She verbalized understanding.

## 2016-06-27 ENCOUNTER — Other Ambulatory Visit: Payer: Self-pay | Admitting: Oncology

## 2016-06-27 DIAGNOSIS — C61 Malignant neoplasm of prostate: Secondary | ICD-10-CM

## 2016-07-01 MED FILL — XTANDI 40 MG CAPSULE: 40 | 30 days supply | Qty: 120 | Fill #0

## 2016-07-26 ENCOUNTER — Ambulatory Visit (HOSPITAL_BASED_OUTPATIENT_CLINIC_OR_DEPARTMENT_OTHER): Payer: Medicare HMO | Admitting: Oncology

## 2016-07-26 ENCOUNTER — Telehealth: Payer: Self-pay | Admitting: Oncology

## 2016-07-26 ENCOUNTER — Other Ambulatory Visit (HOSPITAL_BASED_OUTPATIENT_CLINIC_OR_DEPARTMENT_OTHER): Payer: Medicare HMO

## 2016-07-26 VITALS — BP 137/52 | HR 56 | Temp 97.9°F | Resp 17 | Ht 70.0 in

## 2016-07-26 DIAGNOSIS — C61 Malignant neoplasm of prostate: Secondary | ICD-10-CM

## 2016-07-26 DIAGNOSIS — E291 Testicular hypofunction: Secondary | ICD-10-CM

## 2016-07-26 DIAGNOSIS — C7951 Secondary malignant neoplasm of bone: Secondary | ICD-10-CM

## 2016-07-26 LAB — COMPREHENSIVE METABOLIC PANEL
ALT: 19 U/L (ref 0–55)
AST: 25 U/L (ref 5–34)
Albumin: 3.2 g/dL — ABNORMAL LOW (ref 3.5–5.0)
Alkaline Phosphatase: 112 U/L (ref 40–150)
Anion Gap: 9 mEq/L (ref 3–11)
BUN: 13.2 mg/dL (ref 7.0–26.0)
CO2: 28 meq/L (ref 22–29)
Calcium: 9.3 mg/dL (ref 8.4–10.4)
Chloride: 101 mEq/L (ref 98–109)
Creatinine: 1 mg/dL (ref 0.7–1.3)
EGFR: 73 mL/min/{1.73_m2} — AB (ref >90)
GLUCOSE: 113 mg/dL (ref 70–140)
POTASSIUM: 4.4 meq/L (ref 3.5–5.1)
Sodium: 138 mEq/L (ref 136–145)
TOTAL PROTEIN: 6.8 g/dL (ref 6.4–8.3)
Total Bilirubin: 0.69 mg/dL (ref 0.20–1.20)

## 2016-07-26 LAB — CBC WITH DIFFERENTIAL/PLATELET
BASO%: 0.4 % (ref 0.0–2.0)
Basophils Absolute: 0 10*3/uL (ref 0.0–0.1)
EOS ABS: 0.5 10*3/uL (ref 0.0–0.5)
EOS%: 6.2 % (ref 0.0–7.0)
HCT: 34.2 % — ABNORMAL LOW (ref 38.4–49.9)
HGB: 11.3 g/dL — ABNORMAL LOW (ref 13.0–17.1)
LYMPH%: 25.8 % (ref 14.0–49.0)
MCH: 33.6 pg — AB (ref 27.2–33.4)
MCHC: 33 g/dL (ref 32.0–36.0)
MCV: 101.8 fL — AB (ref 79.3–98.0)
MONO#: 1.1 10*3/uL — ABNORMAL HIGH (ref 0.1–0.9)
MONO%: 13.5 % (ref 0.0–14.0)
NEUT%: 54.1 % (ref 39.0–75.0)
NEUTROS ABS: 4.4 10*3/uL (ref 1.5–6.5)
Platelets: 286 10*3/uL (ref 140–400)
RBC: 3.36 10*6/uL — ABNORMAL LOW (ref 4.20–5.82)
RDW: 14.6 % (ref 11.0–14.6)
WBC: 8.1 10*3/uL (ref 4.0–10.3)
lymph#: 2.1 10*3/uL (ref 0.9–3.3)

## 2016-07-26 NOTE — Telephone Encounter (Signed)
Gave patient AVS and calender per 5/4 los. Per Dr. Alen Blew okay to schedule at 4pm

## 2016-07-26 NOTE — Progress Notes (Signed)
Hematology and Oncology Follow Up Visit  Jeremy Shea [id number] January 28, 1931 83 y.o. 07/26/2016 3:46 PM Jeremy Shea, MDWheless, Jeremy Rinks, MD   Principle Diagnosis: 83 year old gentleman with prostate cancer diagnosed in July 2016. He presented with a PSA of 40 and a Gleason score 5+5 = 10 on a prostate biopsy. Staging workup showed pelvic adenopathy in the external iliac area.  Prior Therapy: He was started on androgen deprivation with Lupron and Casodex under the care of Dr. Alinda Shea. His PSA dropped initially to a nadir of 1.05 in November 2016.  In December 2017 his PSA was up to 11. 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  Current therapy: Xtandi 160 mg daily started on 06/06/2016.  Interim History:  Jeremy Shea presents today for a follow-up visit. Since his last visit, he reports no major changes in his health. He does report some mild fatigue and weight loss but has not dramatically reported any deviation of his performance status and quality of life. He remains on Xtandi and have tolerated it well. He does have dementia and memory issues which have not changed since the last visit. Her blood pressure continues to be within normal range.  He does not report any fevers, chills, sweats or weight loss or appetite changes. He does not report any chest pain, palpitation, orthopnea or leg edema. He does not report any cough, wheezing, hemoptysis or dyspnea on exertion. He does not report any nausea, vomiting, abdominal pain, hematochezia or melanoma. He does not report any skeletal complaints. He does not report any lymphadenopathy or petechiae. Remaining review of systems unremarkable.  Medications: I have reviewed the patient's current medications.  Current Outpatient Prescriptions  Medication Sig Dispense Refill  . acetaminophen (TYLENOL) 500 MG tablet Take 1,000 mg by mouth every 6 (six) hours as needed for pain.    Marland Kitchen atorvastatin  (LIPITOR) 80 MG tablet     . clopidogrel (PLAVIX) 75 MG tablet Take 75 mg by mouth daily.    . fluticasone (FLONASE) 50 MCG/ACT nasal spray Place 1 spray into the nose daily as needed for rhinitis or allergies.     Marland Kitchen loratadine (CLARITIN) 10 MG tablet Take 10 mg by mouth daily as needed for allergies.     . metoprolol tartrate (LOPRESSOR) 25 MG tablet Take 25 mg by mouth 2 (two) times daily.    . pantoprazole (PROTONIX) 40 MG tablet Take 40 mg by mouth 2 (two) times daily.     . Rivastigmine (EXELON) 13.3 MG/24HR PT24 Place 1 patch onto the skin daily.    Marland Kitchen UNABLE TO FIND Med Name: Hormone deprivation shot    . XTANDI 40 MG capsule TAKE 4 CAPSULES BY MOUTH DAILY 120 capsule 0   No current facility-administered medications for this visit.      Allergies:  Allergies  Allergen Reactions  . Carbamazepine Rash and Other (See Comments)    Mental status changes  . Codeine Anxiety and Other (See Comments)    Nervousness. Mental status changes.  . Penicillins Other (See Comments)    Makes him jittery  . Aspirin Other (See Comments)    Burns stomach  . Sulfa Antibiotics Rash    Past Medical History, Surgical history, Social history, and Family History were reviewed and updated.   Remaining ROS negative. Physical Exam: Blood pressure (!) 137/52, pulse (!) 56, temperature 97.9 F (36.6 C), temperature source Oral, resp. rate 17, height 5\' 10"  (1.778 m), SpO2 99 %. ECOG: 2 General appearance: Well-appearing  gentleman without distress. Head: Normocephalic, without obvious abnormality without oral ulcers or thrush. Neck: no adenopathy Lymph nodes: Cervical, supraclavicular, and axillary nodes normal. Heart:regular rate and rhythm, S1, S2 normal, no murmur, click, rub or gallop Lung:chest clear, no wheezing, rales, normal symmetric air entry Abdomin: soft, non-tender, without masses or organomegaly no rebound or guarding. EXT:no erythema, induration, or nodules Skin: No rashes or  lesions.  Lab Results: Lab Results  Component Value Date   WBC 8.1 07/26/2016   HGB 11.3 (L) 07/26/2016   HCT 34.2 (L) 07/26/2016   MCV 101.8 (H) 07/26/2016   PLT 286 07/26/2016     Chemistry      Component Value Date/Time   NA 138 07/26/2016 1448   K 4.4 07/26/2016 1448   CL 102 09/04/2012 1708   CO2 28 07/26/2016 1448   BUN 13.2 07/26/2016 1448   CREATININE 1.0 07/26/2016 1448      Component Value Date/Time   CALCIUM 9.3 07/26/2016 1448   ALKPHOS 112 07/26/2016 1448   AST 25 07/26/2016 1448   ALT 19 07/26/2016 1448   BILITOT 0.69 07/26/2016 1448       Impression and Plan:  83 year old gentleman with the following issues:  1. 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 His initial diagnosis in 2016 with a Gleason score of 10 and a PSA of 40. He was initially treated with androgen deprivation only with excellent response initially but in December 2017 showed a rise in his PSA up to 11.   CT scan and bone scan obtained on 05/13/2016 showed a 2.7 cm soft tissue mass in the right recto-prostatic angle. His bone scan did show foci of abnormal uptake in bilateral ribs and the thoracic spine.  He is currently on Xtandi 160 mg daily started in March 2018.   He does not report any recent complications related to this medication. The plan is to continue with the same dose and schedule. His PSA in April 2018 was 20 and will be repeated today. Different salvage therapy will be used. He developed rapid progression of disease.  3. Bone directed therapy: I will defer this option at this time given the limited bony metastasis noted.  4. Skin rash: The patient have resolved at this time.  5. Dementia: His cognition and memory appears stable at this time.  6. Androgen depravation: I recommended continuing this indefinitely. He is currently receiving at [facility] urology. He will be due on 08/22/2016 and will receive it at the [facility] with his next visit. Risks and benefits of this medication were reviewed today and is agreeable to receive it.  7. Follow-up: The in the next 4 weeks to follow on his progress.   Zola Button, MD 5/4/20183:46 PM

## 2016-07-29 ENCOUNTER — Telehealth: Payer: Self-pay | Admitting: *Deleted

## 2016-07-29 LAB — PSA: Prostate Specific Ag, Serum: 17.7 ng/mL — ABNORMAL HIGH (ref 0.0–4.0)

## 2016-07-29 NOTE — Telephone Encounter (Signed)
Wife joann calling for last PSA results. If dr Alen Blew wants to continue with xtandi, they will need additional financial assistance. Lab results to dr Hazeline Junker desk.

## 2016-07-30 ENCOUNTER — Telehealth: Payer: Self-pay | Admitting: *Deleted

## 2016-07-30 DIAGNOSIS — C61 Malignant neoplasm of prostate: Secondary | ICD-10-CM

## 2016-07-30 MED ORDER — ENZALUTAMIDE 40 MG PO CAPS: 160.0000 mg | ORAL_CAPSULE | Freq: Every day | ORAL | 0 refills | Status: DC

## 2016-07-30 MED FILL — XTANDI 40 MG CAPSULE: 40 | 30 days supply | Qty: 120 | Fill #0

## 2016-07-30 NOTE — Telephone Encounter (Signed)
refill for xtandi e-scribed to [facility] outpatient pharmacy. Notified Johny Drilling, chemo navigator of need for financial assistance.

## 2016-07-31 NOTE — Telephone Encounter (Signed)
Oral Chemotherapy Pharmacist Encounter  I called [facility] to check on patient's Xtandi copayment. Copayment from patient $0 since they still have [place] available (PANF paid $560 for this current fill).   Medication was mailed out today for delivery to patient's home tomorrow (5/10).  Per PANF website, patient still has >$3000 in grant funds remaining, which will cover ~5 more refills.  I called and updated patient's wife, Mechele Claude, with grant status.  If we run out of copayment funds, we can call [employer] and have patient manufacturer assistance application re-activated.  Mechele Claude expressed understanding and appreciation. She knows to call the office with further questions or concerns.  Johny Drilling, PharmD, BCPS, BCOP 07/31/2016  1:48 PM Oral Oncology Clinic [phone]

## 2016-08-20 ENCOUNTER — Other Ambulatory Visit: Payer: Self-pay | Admitting: Oncology

## 2016-08-20 DIAGNOSIS — C61 Malignant neoplasm of prostate: Secondary | ICD-10-CM

## 2016-08-22 ENCOUNTER — Other Ambulatory Visit (HOSPITAL_BASED_OUTPATIENT_CLINIC_OR_DEPARTMENT_OTHER): Payer: Medicare HMO

## 2016-08-22 ENCOUNTER — Encounter: Payer: Self-pay | Admitting: Medical Oncology

## 2016-08-22 ENCOUNTER — Ambulatory Visit (HOSPITAL_BASED_OUTPATIENT_CLINIC_OR_DEPARTMENT_OTHER): Payer: Medicare HMO | Admitting: Oncology

## 2016-08-22 ENCOUNTER — Ambulatory Visit (HOSPITAL_BASED_OUTPATIENT_CLINIC_OR_DEPARTMENT_OTHER): Payer: Medicare HMO

## 2016-08-22 ENCOUNTER — Telehealth: Payer: Self-pay | Admitting: Oncology

## 2016-08-22 VITALS — BP 107/49 | HR 51 | Temp 98.1°F | Resp 18 | Ht 70.0 in

## 2016-08-22 DIAGNOSIS — C61 Malignant neoplasm of prostate: Secondary | ICD-10-CM

## 2016-08-22 DIAGNOSIS — C7951 Secondary malignant neoplasm of bone: Secondary | ICD-10-CM | POA: Diagnosis not present

## 2016-08-22 DIAGNOSIS — E860 Dehydration: Secondary | ICD-10-CM

## 2016-08-22 DIAGNOSIS — E291 Testicular hypofunction: Secondary | ICD-10-CM | POA: Diagnosis not present

## 2016-08-22 DIAGNOSIS — Z5111 Encounter for antineoplastic chemotherapy: Secondary | ICD-10-CM

## 2016-08-22 LAB — COMPREHENSIVE METABOLIC PANEL
ALBUMIN: 3.1 g/dL — AB (ref 3.5–5.0)
ALK PHOS: 118 U/L (ref 40–150)
ALT: 16 U/L (ref 0–55)
AST: 26 U/L (ref 5–34)
Anion Gap: 9 mEq/L (ref 3–11)
BILIRUBIN TOTAL: 0.64 mg/dL (ref 0.20–1.20)
BUN: 14.3 mg/dL (ref 7.0–26.0)
CALCIUM: 9.5 mg/dL (ref 8.4–10.4)
CO2: 26 mEq/L (ref 22–29)
Chloride: 103 mEq/L (ref 98–109)
Creatinine: 1 mg/dL (ref 0.7–1.3)
EGFR: 70 mL/min/{1.73_m2} — AB (ref >90)
GLUCOSE: 110 mg/dL (ref 70–140)
POTASSIUM: 4.6 meq/L (ref 3.5–5.1)
SODIUM: 138 meq/L (ref 136–145)
TOTAL PROTEIN: 6.7 g/dL (ref 6.4–8.3)

## 2016-08-22 LAB — CBC WITH DIFFERENTIAL/PLATELET
BASO%: 0.3 % (ref 0.0–2.0)
BASOS ABS: 0 10*3/uL (ref 0.0–0.1)
EOS ABS: 0.6 10*3/uL — AB (ref 0.0–0.5)
EOS%: 6.6 % (ref 0.0–7.0)
HEMATOCRIT: 33.7 % — AB (ref 38.4–49.9)
HEMOGLOBIN: 11.2 g/dL — AB (ref 13.0–17.1)
LYMPH#: 2.3 10*3/uL (ref 0.9–3.3)
LYMPH%: 25.5 % (ref 14.0–49.0)
MCH: 33.6 pg — AB (ref 27.2–33.4)
MCHC: 33.2 g/dL (ref 32.0–36.0)
MCV: 101.2 fL — AB (ref 79.3–98.0)
MONO#: 0.9 10*3/uL (ref 0.1–0.9)
MONO%: 10.3 % (ref 0.0–14.0)
NEUT#: 5.1 10*3/uL (ref 1.5–6.5)
NEUT%: 57.3 % (ref 39.0–75.0)
NRBC: 0 % (ref 0–0)
Platelets: 257 10*3/uL (ref 140–400)
RBC: 3.33 10*6/uL — ABNORMAL LOW (ref 4.20–5.82)
RDW: 15.3 % — AB (ref 11.0–14.6)
WBC: 8.8 10*3/uL (ref 4.0–10.3)

## 2016-08-22 MED ORDER — LEUPROLIDE ACETATE (4 MONTH) 30 MG IM KIT
30.0000 mg | PACK | Freq: Once | INTRAMUSCULAR | Status: AC
  Administered 2016-08-22: 30 mg via INTRAMUSCULAR
  Filled 2016-08-22: qty 30

## 2016-08-22 MED FILL — XTANDI 40 MG CAPSULE: 40 | 30 days supply | Qty: 120 | Fill #0

## 2016-08-22 NOTE — Progress Notes (Signed)
Hematology and Oncology Follow Up Visit  Jeremy Shea [id number] 12/08/1930 83 y.o. 08/22/2016 3:39 PM Jeremy Shea   Principle Diagnosis: 83 year old gentleman with prostate cancer diagnosed in July 2016. He presented with a PSA of 40 and a Gleason score 5+5 = 10 on a prostate biopsy. Staging workup showed pelvic adenopathy in the external iliac area.  Prior Therapy: He was started on androgen deprivation with Lupron and Casodex under the care of Dr. Alinda Shea. His PSA dropped initially to a nadir of 1.05 in November 2016.  In December 2017 his PSA was up to 11. 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  Current therapy: Xtandi 160 mg daily started on 06/06/2016.  Interim History:  Jeremy Shea presents today for a follow-up visit. Since his last visit, he is about the same without any complaints. He continues to take [facility] without complications or difficulties. He does report some mild fatigue but has not dramatically changed. His mobility has not dramatically changed and continues to use motorized scooter. He did have a urinary tract infection since last visit that has resolved at this time. His appetite have not dramatically changed and trying to encourage hydration. He denied any recent hospitalization or illnesses. He denies any falls or syncope.  He does not report any fevers, chills, sweats or weight loss or appetite changes. He does not report any chest pain, palpitation, orthopnea or leg edema. He does not report any cough, wheezing, hemoptysis or dyspnea on exertion. He does not report any nausea, vomiting, abdominal pain, hematochezia or melanoma. He does not report any skeletal complaints. He does not report any lymphadenopathy or petechiae. Remaining review of systems unremarkable.  Medications: I have reviewed the patient's current medications.  Current Outpatient Prescriptions  Medication Sig Dispense  Refill  . acetaminophen (TYLENOL) 500 MG tablet Take 1,000 mg by mouth every 6 (six) hours as needed for pain.    Marland Kitchen atorvastatin (LIPITOR) 80 MG tablet     . clopidogrel (PLAVIX) 75 MG tablet Take 75 mg by mouth daily.    . fluticasone (FLONASE) 50 MCG/ACT nasal spray Place 1 spray into the nose daily as needed for rhinitis or allergies.     Marland Kitchen loratadine (CLARITIN) 10 MG tablet Take 10 mg by mouth daily as needed for allergies.     . metoprolol tartrate (LOPRESSOR) 25 MG tablet Take 25 mg by mouth 2 (two) times daily.    . pantoprazole (PROTONIX) 40 MG tablet Take 40 mg by mouth 2 (two) times daily.     . Rivastigmine (EXELON) 13.3 MG/24HR PT24 Place 1 patch onto the skin daily.    Marland Kitchen UNABLE TO FIND Med Name: Hormone deprivation shot    . XTANDI 40 MG capsule TAKE 4 CAPSULES (160 MG TOTAL) BY MOUTH DAILY. 120 capsule 0   No current facility-administered medications for this visit.      Allergies:  Allergies  Allergen Reactions  . Carbamazepine Rash and Other (See Comments)    Mental status changes  . Codeine Anxiety and Other (See Comments)    Nervousness. Mental status changes.  . Penicillins Other (See Comments)    Makes him jittery  . Aspirin Other (See Comments)    Burns stomach  . Sulfa Antibiotics Rash    Past Medical History, Surgical history, Social history, and Family History were reviewed and updated.   Remaining ROS negative. Physical Exam: Blood pressure (!) 107/49, pulse (!) 51, temperature 98.1 F (36.7 C), temperature  source Oral, resp. rate 18, height 5\' 10"  (1.778 m), SpO2 98 %. ECOG: 2 General appearance: Well-appearing gentleman appeared without distress. Head: Normocephalic, without obvious abnormality no oral ulcers or lesions. Neck: no adenopathy Lymph nodes: Cervical, supraclavicular, and axillary nodes normal. Heart:regular rate and rhythm, S1, S2 normal, no murmur, click, rub or gallop Lung:chest clear, no wheezing, rales, normal symmetric air  entry Abdomin: soft, non-tender, without masses or organomegaly no shifting dullness or ascites. EXT:no erythema, induration, or nodules Skin: No rashes or lesions.  Lab Results: Lab Results  Component Value Date   WBC 8.8 08/22/2016   HGB 11.2 (L) 08/22/2016   HCT 33.7 (L) 08/22/2016   MCV 101.2 (H) 08/22/2016   PLT 257 08/22/2016     Chemistry      Component Value Date/Time   NA 138 07/26/2016 1448   K 4.4 07/26/2016 1448   CL 102 09/04/2012 1708   CO2 28 07/26/2016 1448   BUN 13.2 07/26/2016 1448   CREATININE 1.0 07/26/2016 1448      Component Value Date/Time   CALCIUM 9.3 07/26/2016 1448   ALKPHOS 112 07/26/2016 1448   AST 25 07/26/2016 1448   ALT 19 07/26/2016 1448   BILITOT 0.69 07/26/2016 1448      Results for Jeremy Shea, Jeremy Shea (MRN [id number]) as of 08/22/2016 15:21  Ref. Range 06/25/2016 14:50 07/26/2016 14:48  PSA Latest Ref Range: 0.0 - 4.0 ng/mL 20.7 (H) 17.7 (H)   Impression and Plan:  83 year old gentleman with the following issues:  1. 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 His initial diagnosis in 2016 with a Gleason score of 10 and a PSA of 40. He was initially treated with androgen deprivation only with excellent response initially but in December 2017 showed a rise in his PSA up to 11.   CT scan and bone scan obtained on 05/13/2016 showed a 2.7 cm soft tissue mass in the right recto-prostatic angle. His bone scan did show foci of abnormal uptake in bilateral ribs and the thoracic spine.  He is currently on Xtandi 160 mg daily started in March 2018. His PSA did decline slightly over the last 2 months and overall his disease is stable. Risks and benefits of continuing this medication were reviewed today and is agreeable to continue.    3. Bone directed therapy: I will defer this option at this time given the limited bony metastasis noted.  4. Dehydration: Continue to encourage increase fluid intake  to prevent further urinary tract infections.  5. Dementia: His cognition and memory appears stable at this time.  6. Androgen depravation: He'll receive Lupron today and repeated every 4 months.  7. Follow-up: The in the next 4 weeks to follow on his progress.   Zola Button, Shea 5/31/20183:39 PM

## 2016-08-22 NOTE — Patient Instructions (Signed)
Leuprolide depot injection What is this medicine? LEUPROLIDE (loo PROE lide) is a man-made protein that acts like a natural hormone in the body. It decreases testosterone in men and decreases estrogen in women. In men, this medicine is used to treat advanced prostate cancer. In women, some forms of this medicine may be used to treat endometriosis, uterine fibroids, or other male hormone-related problems. This medicine may be used for other purposes; ask your health care provider or pharmacist if you have questions. COMMON BRAND NAME(S): Eligard, Lupron Depot, Lupron Depot-Ped, Viadur What should I tell my health care provider before I take this medicine? They need to know if you have any of these conditions: -diabetes -heart disease or previous heart attack -high blood pressure -high cholesterol -mental illness -osteoporosis -pain or difficulty passing urine -seizures -spinal cord metastasis -stroke -suicidal thoughts, plans, or attempt; a previous suicide attempt by you or a family member -tobacco smoker -unusual vaginal bleeding (women) -an unusual or allergic reaction to leuprolide, benzyl alcohol, other medicines, foods, dyes, or preservatives -pregnant or trying to get pregnant -breast-feeding How should I use this medicine? This medicine is for injection into a muscle or for injection under the skin. It is given by a health care professional in a hospital or clinic setting. The specific product will determine how it will be given to you. Make sure you understand which product you receive and how often you will receive it. Talk to your pediatrician regarding the use of this medicine in children. Special care may be needed. Overdosage: If you think you have taken too much of this medicine contact a poison control center or emergency room at once. NOTE: This medicine is only for you. Do not share this medicine with others. What if I miss a dose? It is important not to miss a dose.  Call your doctor or health care professional if you are unable to keep an appointment. Depot injections: Depot injections are given either once-monthly, every 12 weeks, every 16 weeks, or every 24 weeks depending on the product you are prescribed. The product you are prescribed will be based on if you are male or male, and your condition. Make sure you understand your product and dosing. What may interact with this medicine? Do not take this medicine with any of the following medications: -chasteberry This medicine may also interact with the following medications: -herbal or dietary supplements, like black cohosh or DHEA -male hormones, like estrogens or progestins and birth control pills, patches, rings, or injections -male hormones, like testosterone This list may not describe all possible interactions. Give your health care provider a list of all the medicines, herbs, non-prescription drugs, or dietary supplements you use. Also tell them if you smoke, drink alcohol, or use illegal drugs. Some items may interact with your medicine. What should I watch for while using this medicine? Visit your doctor or health care professional for regular checks on your progress. During the first weeks of treatment, your symptoms may get worse, but then will improve as you continue your treatment. You may get hot flashes, increased bone pain, increased difficulty passing urine, or an aggravation of nerve symptoms. Discuss these effects with your doctor or health care professional, some of them may improve with continued use of this medicine. Male patients may experience a menstrual cycle or spotting during the first months of therapy with this medicine. If this continues, contact your doctor or health care professional. What side effects may I notice from receiving this medicine? Side   effects that you should report to your doctor or health care professional as soon as possible: -allergic reactions like skin  rash, itching or hives, swelling of the face, lips, or tongue -breathing problems -chest pain -depression or memory disorders -pain in your legs or groin -pain at site where injected or implanted -seizures -severe headache -swelling of the feet and legs -suicidal thoughts or other mood changes -visual changes -vomiting Side effects that usually do not require medical attention (report to your doctor or health care professional if they continue or are bothersome): -breast swelling or tenderness -decrease in sex drive or performance -diarrhea -hot flashes -loss of appetite -muscle, joint, or bone pains -nausea -redness or irritation at site where injected or implanted -skin problems or acne This list may not describe all possible side effects. Call your doctor for medical advice about side effects. You may report side effects to FDA at 1-800-FDA-1088. Where should I keep my medicine? This drug is given in a hospital or clinic and will not be stored at home. NOTE: This sheet is a summary. It may not cover all possible information. If you have questions about this medicine, talk to your doctor, pharmacist, or health care provider.  2018 Elsevier/Gold Standard (2015-08-24 09:45:53)  

## 2016-08-22 NOTE — Telephone Encounter (Signed)
Appointments scheduled per 08/22/16. Patient was given a copy of the AVS report and appointment schedule, per 08/22/16 los.

## 2016-08-23 ENCOUNTER — Telehealth: Payer: Self-pay | Admitting: *Deleted

## 2016-08-23 LAB — PSA: PROSTATE SPECIFIC AG, SERUM: 17.6 ng/mL — AB (ref 0.0–4.0)

## 2016-08-23 NOTE — Telephone Encounter (Signed)
As noted below by Dr. Alen Blew, I informed patient's wife of PSA results. Wife verbalized understanding.

## 2016-08-23 NOTE — Telephone Encounter (Signed)
-----   Message from Wyatt Portela, MD sent at 08/23/2016  3:38 PM EDT ----- Please let him know his psa. Unchanged.

## 2016-08-23 NOTE — Progress Notes (Signed)
[name] tolerating his Gillermina Phy well. His wife states he needs to increase his fluid intake which he is working on. I will continue to follow.

## 2016-09-17 ENCOUNTER — Telehealth: Payer: Self-pay | Admitting: Oncology

## 2016-09-17 ENCOUNTER — Other Ambulatory Visit (HOSPITAL_BASED_OUTPATIENT_CLINIC_OR_DEPARTMENT_OTHER): Payer: Medicare HMO

## 2016-09-17 ENCOUNTER — Ambulatory Visit (HOSPITAL_BASED_OUTPATIENT_CLINIC_OR_DEPARTMENT_OTHER): Payer: Medicare HMO | Admitting: Oncology

## 2016-09-17 VITALS — BP 109/43 | HR 90 | Temp 97.9°F | Resp 18 | Ht 70.0 in | Wt 153.5 lb

## 2016-09-17 DIAGNOSIS — C7951 Secondary malignant neoplasm of bone: Secondary | ICD-10-CM | POA: Diagnosis not present

## 2016-09-17 DIAGNOSIS — E291 Testicular hypofunction: Secondary | ICD-10-CM | POA: Diagnosis not present

## 2016-09-17 DIAGNOSIS — C61 Malignant neoplasm of prostate: Secondary | ICD-10-CM

## 2016-09-17 LAB — COMPREHENSIVE METABOLIC PANEL
ALT: 21 U/L (ref 0–55)
ANION GAP: 11 meq/L (ref 3–11)
AST: 23 U/L (ref 5–34)
Albumin: 3.1 g/dL — ABNORMAL LOW (ref 3.5–5.0)
Alkaline Phosphatase: 117 U/L (ref 40–150)
BILIRUBIN TOTAL: 0.76 mg/dL (ref 0.20–1.20)
BUN: 12.4 mg/dL (ref 7.0–26.0)
CO2: 27 meq/L (ref 22–29)
Calcium: 9.3 mg/dL (ref 8.4–10.4)
Chloride: 102 mEq/L (ref 98–109)
Creatinine: 0.8 mg/dL (ref 0.7–1.3)
EGFR: 81 mL/min/{1.73_m2} — ABNORMAL LOW (ref >90)
Glucose: 118 mg/dl (ref 70–140)
Potassium: 3.6 mEq/L (ref 3.5–5.1)
Sodium: 140 mEq/L (ref 136–145)
TOTAL PROTEIN: 6.7 g/dL (ref 6.4–8.3)

## 2016-09-17 LAB — CBC WITH DIFFERENTIAL/PLATELET
BASO%: 0.9 % (ref 0.0–2.0)
Basophils Absolute: 0.1 10*3/uL (ref 0.0–0.1)
EOS%: 7.8 % — ABNORMAL HIGH (ref 0.0–7.0)
Eosinophils Absolute: 0.6 10*3/uL — ABNORMAL HIGH (ref 0.0–0.5)
HCT: 34.3 % — ABNORMAL LOW (ref 38.4–49.9)
HGB: 11.8 g/dL — ABNORMAL LOW (ref 13.0–17.1)
LYMPH#: 2.1 10*3/uL (ref 0.9–3.3)
LYMPH%: 29.1 % (ref 14.0–49.0)
MCH: 34.2 pg — ABNORMAL HIGH (ref 27.2–33.4)
MCHC: 34.3 g/dL (ref 32.0–36.0)
MCV: 99.7 fL — ABNORMAL HIGH (ref 79.3–98.0)
MONO#: 0.9 10*3/uL (ref 0.1–0.9)
MONO%: 12.3 % (ref 0.0–14.0)
NEUT%: 49.9 % (ref 39.0–75.0)
NEUTROS ABS: 3.6 10*3/uL (ref 1.5–6.5)
PLATELETS: 299 10*3/uL (ref 140–400)
RBC: 3.44 10*6/uL — AB (ref 4.20–5.82)
RDW: 15.7 % — ABNORMAL HIGH (ref 11.0–14.6)
WBC: 7.3 10*3/uL (ref 4.0–10.3)

## 2016-09-17 NOTE — Progress Notes (Signed)
Hematology and Oncology Follow Up Visit  Jeremy Shea [id number] November 23, 1930 83 y.o. 09/17/2016 3:31 PM [name], MDWheless, [name], MD   Principle Diagnosis: 83 year old gentleman with prostate cancer diagnosed in July 2016. He presented with a PSA of 40 and a Gleason score 5+5 = 10 on a prostate biopsy. Staging workup showed pelvic adenopathy in the external iliac area.  Prior Therapy: He was started on androgen deprivation with Lupron and Casodex under the care of Dr. Alinda Money. His PSA dropped initially to a nadir of 1.05 in November 2016.  In December 2017 his PSA was up to 11. 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  Current therapy: Xtandi 160 mg daily started on 06/06/2016.  Interim History:  Mr. Hubbert presents today for a follow-up visit. Since his last visit, he reports no major changes in his health. He continues to be limited in his mobility and uses a motorized scooter. He was needs assistance but does transfer with help. He continues to take [facility] without complications or difficulties. He does report some mild fatigue but has not dramatically changed. His appetite has declined over the last few years and have lost weight independent of Xtandi. He denied any recent hospitalization or illnesses. He denies any falls or syncope.  He does not report any fevers, chills, sweats or weight loss or appetite changes. He does not report any chest pain, palpitation, orthopnea or leg edema. He does not report any cough, wheezing, hemoptysis or dyspnea on exertion. He does not report any nausea, vomiting, abdominal pain, hematochezia or melanoma. He does not report any skeletal complaints. He does not report any lymphadenopathy or petechiae. Remaining review of systems unremarkable.  Medications: I have reviewed the patient's current medications.  Current Outpatient Prescriptions  Medication Sig Dispense Refill  . acetaminophen  (TYLENOL) 500 MG tablet Take 1,000 mg by mouth every 6 (six) hours as needed for pain.    Marland Kitchen atorvastatin (LIPITOR) 80 MG tablet     . clopidogrel (PLAVIX) 75 MG tablet Take 75 mg by mouth daily.    . fluticasone (FLONASE) 50 MCG/ACT nasal spray Place 1 spray into the nose daily as needed for rhinitis or allergies.     Marland Kitchen loratadine (CLARITIN) 10 MG tablet Take 10 mg by mouth daily as needed for allergies.     . metoprolol tartrate (LOPRESSOR) 25 MG tablet Take 25 mg by mouth 2 (two) times daily.    . pantoprazole (PROTONIX) 40 MG tablet Take 40 mg by mouth 2 (two) times daily.     . Rivastigmine (EXELON) 13.3 MG/24HR PT24 Place 1 patch onto the skin daily.    Marland Kitchen UNABLE TO FIND Med Name: Hormone deprivation shot    . XTANDI 40 MG capsule TAKE 4 CAPSULES (160 MG TOTAL) BY MOUTH DAILY. 120 capsule 0   No current facility-administered medications for this visit.      Allergies:  Allergies  Allergen Reactions  . Carbamazepine Rash and Other (See Comments)    Mental status changes  . Codeine Anxiety and Other (See Comments)    Nervousness. Mental status changes.  . Penicillins Other (See Comments)    Makes him jittery  . Aspirin Other (See Comments)    Burns stomach  . Sulfa Antibiotics Rash    Past Medical History, Surgical history, Social history, and Family History were reviewed and updated.   Remaining ROS negative. Physical Exam: Blood pressure (!) 109/43, pulse 90, temperature 97.9 F (36.6 C), temperature source Oral,  resp. rate 18, height 5\' 10"  (1.778 m), weight 153 lb 8 oz (69.6 kg), SpO2 100 %. ECOG: 2 General appearance: Alert, awake gentleman appeared without distress. Head: Normocephalic, without obvious abnormality no oral ulcers.. Neck: no adenopathy Lymph nodes: Cervical, supraclavicular, and axillary nodes normal. Heart:regular rate and rhythm, S1, S2 normal, no murmur, click, rub or gallop Lung:chest clear, no wheezing, rales, normal symmetric air entry Abdomin:  soft, non-tender, without masses or organomegaly no rebound or guarding. EXT:no erythema, induration, or nodules Skin: No rashes or lesions.  Lab Results: Lab Results  Component Value Date   WBC 7.3 09/17/2016   HGB 11.8 (L) 09/17/2016   HCT 34.3 (L) 09/17/2016   MCV 99.7 (H) 09/17/2016   PLT 299 09/17/2016     Chemistry      Component Value Date/Time   NA 138 08/22/2016 1516   K 4.6 08/22/2016 1516   CL 102 09/04/2012 1708   CO2 26 08/22/2016 1516   BUN 14.3 08/22/2016 1516   CREATININE 1.0 08/22/2016 1516      Component Value Date/Time   CALCIUM 9.5 08/22/2016 1516   ALKPHOS 118 08/22/2016 1516   AST 26 08/22/2016 1516   ALT 16 08/22/2016 1516   BILITOT 0.64 08/22/2016 1516       Impression and Plan:  83 year old gentleman with the following issues:  1. 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 His initial diagnosis in 2016 with a Gleason score of 10 and a PSA of 40. He was initially treated with androgen deprivation only with excellent response initially but in December 2017 showed a rise in his PSA up to 11.   CT scan and bone scan obtained on 05/13/2016 showed a 2.7 cm soft tissue mass in the right recto-prostatic angle. His bone scan did show foci of abnormal uptake in bilateral ribs and the thoracic spine.  He is currently on Xtandi 160 mg daily started in March 2018.   His PSA remains relatively stable without major complications related to this medication. Risks and benefits of continuing Gillermina Phy was discussed today and he is agreeable to continue.   3. Bone directed therapy: I will defer this option at this time given the limited bony metastasis noted.  4. Weight loss: Appears to be chronic in nature and related to his dementia rather than Xtandi.  5. Dementia: His cognition and memory appears stable at this time.  6. Androgen depravation: This will continue indefinitely. He will receive his next  injection after 9/31/2018.  7. Follow-up: In 2 months.Zola Button, MD 6/26/20183:31 PM

## 2016-09-17 NOTE — Telephone Encounter (Signed)
Scheduled appt per 6/26 los - Gave patient AVS and calender per los.  

## 2016-09-18 ENCOUNTER — Telehealth: Payer: Self-pay | Admitting: *Deleted

## 2016-09-18 LAB — PSA: Prostate Specific Ag, Serum: 17.1 ng/mL — ABNORMAL HIGH (ref 0.0–4.0)

## 2016-09-18 NOTE — Telephone Encounter (Signed)
-----   Message from Wyatt Portela, MD sent at 09/18/2016  7:26 AM EDT ----- Please let him and his wife know his PSA is the down some and overall stable. No changes.

## 2016-09-18 NOTE — Telephone Encounter (Signed)
Spoke with wife, gave results of last PSA 

## 2016-09-24 ENCOUNTER — Other Ambulatory Visit: Payer: Self-pay | Admitting: Oncology

## 2016-09-24 DIAGNOSIS — C61 Malignant neoplasm of prostate: Secondary | ICD-10-CM

## 2016-09-24 MED FILL — XTANDI 40 MG CAPSULE: 40 | 30 days supply | Qty: 120 | Fill #0

## 2016-10-01 ENCOUNTER — Telehealth: Payer: Self-pay | Admitting: Pharmacy Technician

## 2016-10-01 NOTE — Telephone Encounter (Signed)
Oral Oncology Patient Advocate Encounter  Was successful in securing patient an $ 6500 grant from [employer] ([employer]) to provide copayment coverage for his Xtandi.  This will keep the out of pocket expense at $0.    I have spoken with the patient's wife. She voiced understanding and appreciation.     The billing information is as follows and has been shared with [place].   Member ID: [id number] Group ID: [id number] RxBin: [id number] Dates of Eligibility: 04-04-2016 through 10-01-2017  Fabio Asa. Melynda Keller, [place] [facility] Patient Advocate [phone] 10/01/2016 12:56 PM

## 2016-10-23 ENCOUNTER — Other Ambulatory Visit: Payer: Self-pay | Admitting: Oncology

## 2016-10-23 DIAGNOSIS — C61 Malignant neoplasm of prostate: Secondary | ICD-10-CM

## 2016-10-23 MED FILL — XTANDI 40 MG CAPSULE: 40 | 30 days supply | Qty: 120 | Fill #0

## 2016-10-24 ENCOUNTER — Telehealth: Payer: Self-pay | Admitting: *Deleted

## 2016-10-24 NOTE — Telephone Encounter (Signed)
[facility] for dental work.

## 2016-10-24 NOTE — Telephone Encounter (Signed)
This nurse called Maudie Mercury @ Dr. Joaquin Courts office and informed her that Dr. Alen Blew has cleared him for dental work. She verbalized understanding.

## 2016-10-24 NOTE — Telephone Encounter (Signed)
"  Kim with Dr. Jerel Shepherd [phone] calling about mutual patient Jeremy Shea.  We need to make sure it is okay to perform dental extraction.  Cancelling today's appointment."  Confirmed demographic information.  Call forwarded to collaborative.

## 2016-10-24 NOTE — Telephone Encounter (Signed)
Received voice mail message from Jeremy Shea stating, "patient needs to have extensive dental work done. He needs Xrays, a cleaning and several extractions. We need Dr. Hazeline Junker OK for dental clearance. His dentist is Dr. Marily Lente. My return number is [phone].

## 2016-11-15 ENCOUNTER — Other Ambulatory Visit (HOSPITAL_BASED_OUTPATIENT_CLINIC_OR_DEPARTMENT_OTHER): Payer: Medicare HMO

## 2016-11-15 ENCOUNTER — Ambulatory Visit (HOSPITAL_BASED_OUTPATIENT_CLINIC_OR_DEPARTMENT_OTHER): Payer: Medicare HMO | Admitting: Oncology

## 2016-11-15 ENCOUNTER — Telehealth: Payer: Self-pay | Admitting: Oncology

## 2016-11-15 VITALS — BP 113/45 | HR 52 | Temp 97.8°F | Resp 18 | Ht 70.0 in

## 2016-11-15 DIAGNOSIS — C61 Malignant neoplasm of prostate: Secondary | ICD-10-CM

## 2016-11-15 DIAGNOSIS — C7951 Secondary malignant neoplasm of bone: Secondary | ICD-10-CM | POA: Diagnosis not present

## 2016-11-15 DIAGNOSIS — R634 Abnormal weight loss: Secondary | ICD-10-CM | POA: Diagnosis not present

## 2016-11-15 DIAGNOSIS — E291 Testicular hypofunction: Secondary | ICD-10-CM | POA: Diagnosis not present

## 2016-11-15 DIAGNOSIS — F0390 Unspecified dementia without behavioral disturbance: Secondary | ICD-10-CM

## 2016-11-15 LAB — COMPREHENSIVE METABOLIC PANEL
ALBUMIN: 2.9 g/dL — AB (ref 3.5–5.0)
ALK PHOS: 117 U/L (ref 40–150)
ALT: 14 U/L (ref 0–55)
AST: 25 U/L (ref 5–34)
Anion Gap: 8 mEq/L (ref 3–11)
BILIRUBIN TOTAL: 0.55 mg/dL (ref 0.20–1.20)
BUN: 9.1 mg/dL (ref 7.0–26.0)
CALCIUM: 9.3 mg/dL (ref 8.4–10.4)
CO2: 27 mEq/L (ref 22–29)
Chloride: 101 mEq/L (ref 98–109)
Creatinine: 0.8 mg/dL (ref 0.7–1.3)
EGFR: 82 mL/min/{1.73_m2} — AB (ref >90)
Glucose: 114 mg/dl (ref 70–140)
POTASSIUM: 3.6 meq/L (ref 3.5–5.1)
Sodium: 136 mEq/L (ref 136–145)
TOTAL PROTEIN: 6.6 g/dL (ref 6.4–8.3)

## 2016-11-15 LAB — CBC WITH DIFFERENTIAL/PLATELET
BASO%: 1.2 % (ref 0.0–2.0)
BASOS ABS: 0.1 10*3/uL (ref 0.0–0.1)
EOS ABS: 0.5 10*3/uL (ref 0.0–0.5)
EOS%: 6.2 % (ref 0.0–7.0)
HEMATOCRIT: 33.7 % — AB (ref 38.4–49.9)
HEMOGLOBIN: 11.3 g/dL — AB (ref 13.0–17.1)
LYMPH%: 24.7 % (ref 14.0–49.0)
MCH: 33.6 pg — AB (ref 27.2–33.4)
MCHC: 33.5 g/dL (ref 32.0–36.0)
MCV: 100.3 fL — AB (ref 79.3–98.0)
MONO#: 0.9 10*3/uL (ref 0.1–0.9)
MONO%: 11.5 % (ref 0.0–14.0)
NEUT%: 56.4 % (ref 39.0–75.0)
NEUTROS ABS: 4.3 10*3/uL (ref 1.5–6.5)
Platelets: 313 10*3/uL (ref 140–400)
RBC: 3.36 10*6/uL — ABNORMAL LOW (ref 4.20–5.82)
RDW: 16.3 % — ABNORMAL HIGH (ref 11.0–14.6)
WBC: 7.7 10*3/uL (ref 4.0–10.3)
lymph#: 1.9 10*3/uL (ref 0.9–3.3)

## 2016-11-15 NOTE — Progress Notes (Signed)
Hematology and Oncology Follow Up Visit  Jeremy Shea [id number] 28-Jul-1930 83 y.o. 11/15/2016 3:14 PM Jeremy Shea, MDWheless, Jeremy Rinks, MD   Principle Diagnosis: 83 year old gentleman with prostate cancer diagnosed in July 2016. He presented with a PSA of 40 and a Gleason score 5+5 = 10 on a prostate biopsy. Staging workup showed pelvic adenopathy in the external iliac area.  Prior Therapy: He was started on androgen deprivation with Lupron and Casodex under the care of Jeremy Shea. His PSA dropped initially to a nadir of 1.05 in November 2016.  In December 2017 his PSA was up to 11. 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  Current therapy: Xtandi 160 mg daily started on 06/06/2016.  Interim History:  Jeremy Shea presents today for a follow-up visit. Since his last visit, he was hospitalized briefly in a local hospital because of a presumed sepsis. He was treated with intravenous antibiotics and has fully recovered at this time. He reports poor appetite and lost weight predominantly related to his dementia. He continues to take extending without any major complications. He continues to be limited in his mobility and uses a motorized scooter. He was needs assistance Shea does transfer with help.  He does report some mild fatigue Shea has not dramatically changed. His quality of life has not changed much over all his overall health has been declining.   He does not report any fevers, chills, sweats or weight loss or appetite changes. He does not report any chest pain, palpitation, orthopnea or leg edema. He does not report any cough, wheezing, hemoptysis or dyspnea on exertion. He does not report any nausea, vomiting, abdominal pain, hematochezia or melanoma. He does not report any skeletal complaints. He does not report any lymphadenopathy or petechiae. Remaining review of systems unremarkable.  Medications: I have reviewed the patient's  current medications.  Current Outpatient Prescriptions  Medication Sig Dispense Refill  . acetaminophen (TYLENOL) 500 MG tablet Take 1,000 mg by mouth every 6 (six) hours as needed for pain.    Marland Kitchen atorvastatin (LIPITOR) 80 MG tablet     . clopidogrel (PLAVIX) 75 MG tablet Take 75 mg by mouth daily.    . fluticasone (FLONASE) 50 MCG/ACT nasal spray Place 1 spray into the nose daily as needed for rhinitis or allergies.     Marland Kitchen loratadine (CLARITIN) 10 MG tablet Take 10 mg by mouth daily as needed for allergies.     . metoprolol tartrate (LOPRESSOR) 25 MG tablet Take 25 mg by mouth 2 (two) times daily.    . pantoprazole (PROTONIX) 40 MG tablet Take 40 mg by mouth 2 (two) times daily.     . Rivastigmine (EXELON) 13.3 MG/24HR PT24 Place 1 patch onto the skin daily.    Marland Kitchen UNABLE TO FIND Med Name: Hormone deprivation shot    . XTANDI 40 MG capsule TAKE 4 CAPSULES (160 MG TOTAL) BY MOUTH DAILY. 120 capsule 0   No current facility-administered medications for this visit.      Allergies:  Allergies  Allergen Reactions  . Carbamazepine Rash and Other (See Comments)    Mental status changes  . Codeine Anxiety and Other (See Comments)    Nervousness. Mental status changes.  . Penicillins Other (See Comments)    Makes him jittery  . Aspirin Other (See Comments)    Burns stomach  . Sulfa Antibiotics Rash    Past Medical History, Surgical history, Social history, and Family History were reviewed and updated.  Remaining ROS negative. Physical Exam: Blood pressure (!) 113/45, pulse (!) 52, temperature 97.8 F (36.6 C), temperature source Oral, resp. rate 18, height 5\' 10"  (1.778 m), SpO2 99 %. ECOG: 2 General appearance: Chronically ill-appearing gentleman without distress. Head: Normocephalic, without obvious abnormality no oral thrush or ulcers.. Neck: no adenopathy Lymph nodes: Cervical, supraclavicular, and axillary nodes normal. Heart:regular rate and rhythm, S1, S2 normal, no murmur,  click, rub or gallop Lung:chest clear, no wheezing, rales, normal symmetric air entry Abdomin: soft, non-tender, without masses or organomegaly no shifting dullness or ascites. EXT:no erythema, induration, or nodules Skin: No rashes or lesions.  Lab Results: Lab Results  Component Value Date   WBC 7.7 11/15/2016   HGB 11.3 (L) 11/15/2016   HCT 33.7 (L) 11/15/2016   MCV 100.3 (H) 11/15/2016   PLT 313 11/15/2016     Chemistry      Component Value Date/Time   NA 140 09/17/2016 1501   K 3.6 09/17/2016 1501   CL 102 09/04/2012 1708   CO2 27 09/17/2016 1501   BUN 12.4 09/17/2016 1501   CREATININE 0.8 09/17/2016 1501      Component Value Date/Time   CALCIUM 9.3 09/17/2016 1501   ALKPHOS 117 09/17/2016 1501   AST 23 09/17/2016 1501   ALT 21 09/17/2016 1501   BILITOT 0.76 09/17/2016 1501       Impression and Plan:  83 year old gentleman with the following issues:  1. 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 His initial diagnosis in 2016 with a Gleason score of 10 and a PSA of 40. He was initially treated with androgen deprivation only with excellent response initially Shea in December 2017 showed a rise in his PSA up to 11.   CT scan and bone scan obtained on 05/13/2016 showed a 2.7 cm soft tissue mass in the right recto-prostatic angle. His bone scan did show foci of abnormal uptake in bilateral ribs and the thoracic spine.  He is currently on Xtandi 160 mg daily started in March 2018.   His PSA remains Stable around 17 without any symptoms related to his cancer. Risks and benefits of continuing this medication was reviewed today and is agreeable to continue.   3. Bone directed therapy: I will defer this option at this time given the limited bony metastasis noted.  4. Weight loss: Related to his dementia more than extended period  5. Dementia: Appears to be declining which could be limited factor for any aggressive  therapy in the future.  6. Androgen depravation: This will continue indefinitely. He will receive his next injection with the next visit in November 2018.  7. Follow-up: In 2 months.Jeremy Button, MD 8/24/20183:14 PM

## 2016-11-15 NOTE — Telephone Encounter (Signed)
Gave patient's wife AVS report and a calendar for upcoming November appointments.

## 2016-11-16 LAB — PSA: PROSTATE SPECIFIC AG, SERUM: 32 ng/mL — AB (ref 0.0–4.0)

## 2016-11-18 ENCOUNTER — Telehealth: Payer: Self-pay | Admitting: *Deleted

## 2016-11-18 NOTE — Telephone Encounter (Signed)
"  My husband was seen last Friday.  Calling for PSA results." Provided 11-15-2016 PSA = 32.  On 09-17-2016 PSA = 17.1. "Would you have Dr. Alen Blew call upon his return.  What are next steps.  Do we need to add another medication or what?  Return number [phone]."    Routing call information to collaborative nurse and provider for review upon return 11-26-2016.  Further patient communication through collaborative nurse.

## 2016-11-21 ENCOUNTER — Encounter: Payer: Self-pay | Admitting: *Deleted

## 2016-11-26 ENCOUNTER — Encounter: Payer: Self-pay | Admitting: *Deleted

## 2016-11-26 ENCOUNTER — Telehealth: Payer: Self-pay | Admitting: *Deleted

## 2016-11-26 NOTE — Telephone Encounter (Signed)
Wife joann calling. States patient's PSA has dooubled. States he is still on xtandi. She wants to know if dr Alen Blew is going to change to a different medication.

## 2016-11-26 NOTE — Telephone Encounter (Signed)
PSA results discussed over the phone today. No changes for now.

## 2016-12-04 ENCOUNTER — Other Ambulatory Visit: Payer: Self-pay | Admitting: Oncology

## 2016-12-04 DIAGNOSIS — C61 Malignant neoplasm of prostate: Secondary | ICD-10-CM

## 2016-12-04 MED FILL — XTANDI 40 MG CAPSULE: 40 | 30 days supply | Qty: 120 | Fill #0

## 2016-12-26 ENCOUNTER — Other Ambulatory Visit: Payer: Self-pay | Admitting: Oncology

## 2016-12-26 DIAGNOSIS — C61 Malignant neoplasm of prostate: Secondary | ICD-10-CM

## 2017-01-01 MED FILL — XTANDI 40 MG CAPSULE: 40 | 30 days supply | Qty: 120 | Fill #0

## 2017-01-10 ENCOUNTER — Telehealth: Payer: Self-pay | Admitting: *Deleted

## 2017-01-10 ENCOUNTER — Encounter: Payer: Self-pay | Admitting: *Deleted

## 2017-01-10 NOTE — Telephone Encounter (Signed)
Wife Jeremy Shea calling. Patient is declining. Fell on motorized scooter and was hospitalized for 5 days. Unable to take xtandi or any large pills. Is incontinent.has foley cath. He is in a hospital bed and they have rented a hoyer lift to get him from bed to chair. Home health is making house calls. Has pressure sore on buttocks. States he has a mass on his abdomen just below the umbilicus for 07-37 days. States he has an appt with dr Alen Blew November 1st, but she doesn't think she can get him here. She wants to know if this is normal for him to be declining so quickly? [phone]

## 2017-01-13 ENCOUNTER — Telehealth: Payer: Self-pay

## 2017-01-13 DIAGNOSIS — C61 Malignant neoplasm of prostate: Secondary | ICD-10-CM

## 2017-01-13 MED ORDER — ONDANSETRON HCL 8 MG PO TABS: 4.0000 mg | ORAL_TABLET | Freq: Three times a day (TID) | ORAL | 0 refills | Status: AC | PRN

## 2017-01-13 NOTE — Telephone Encounter (Signed)
Patti with [facility] called requesting referral for the pt. Referral given and last OV, H&P,  labs, demographics, bone scan faxed to [phone].

## 2017-01-13 NOTE — Telephone Encounter (Signed)
Precious Bard called asking if zofran could be added to [facility] and if oxycodone which is 5 mg q6hr can be increased to q 4 hr. S/w Dr Alvy Bimler and both orders OK'd, Called patti back with info. zofran e-scribed.

## 2017-01-13 NOTE — Telephone Encounter (Signed)
Received telehealth report from weekend. Called wife. Pt did go to ED, now he is back home. Wife states she called [facility] in [place]. and will be talking with them today. Explained hospice will contact Dr Alen Blew after decision made for admission and attending physician status.

## 2017-01-13 NOTE — Addendum Note (Signed)
Addended by: Janace Hoard on: 01/13/2017 02:11 PM   Modules accepted: Orders

## 2017-01-14 ENCOUNTER — Encounter: Payer: Self-pay | Admitting: Hematology and Oncology

## 2017-01-14 ENCOUNTER — Encounter: Payer: Self-pay | Admitting: Oncology

## 2017-01-14 NOTE — Progress Notes (Signed)
Received additional questionnaire from [facility].  Called [employer](Shante') to provide specific diagnosis.  PA approved for Ondansetron 03/23/16-03/24/17.  PA approval will be sent via fax as well.  Called [facility] in [place](Teri) to advise of approval. She states its not showing yet but she will run again in a few minutes.

## 2017-01-14 NOTE — Progress Notes (Signed)
Received PA request for Ondansetron.  Submitted via Cover My Meds online:   Laveda Abbe g Barz jr ([employer]) [phone]  Need help? Call us at [phone]   Status  Sent to [employer]  Next Steps  The plan will fax you a determination, typically within 1 to 5 business days.

## 2017-01-15 ENCOUNTER — Encounter: Payer: Self-pay | Admitting: Oncology

## 2017-01-15 ENCOUNTER — Encounter: Payer: Self-pay | Admitting: *Deleted

## 2017-01-15 ENCOUNTER — Telehealth: Payer: Self-pay | Admitting: *Deleted

## 2017-01-15 NOTE — Telephone Encounter (Signed)
Spoke with wife joanne, per dr Alen Blew patient needs hospice. Per wife, hospice came out Sunday with hospital bed and other DME. She does not know who the attending is.

## 2017-01-15 NOTE — Progress Notes (Signed)
Received PA approval for Ondansetron via fax from [facility].  PA approved 03/23/16-03/24/17

## 2017-01-23 ENCOUNTER — Other Ambulatory Visit: Payer: Medicare HMO

## 2017-01-23 ENCOUNTER — Ambulatory Visit: Payer: Medicare HMO | Admitting: Oncology

## 2017-01-23 ENCOUNTER — Ambulatory Visit: Payer: Medicare HMO

## 2017-01-23 DEATH — deceased

## 2017-02-14 ENCOUNTER — Encounter: Payer: Self-pay | Admitting: *Deleted
